# Patient Record
Sex: Female | Born: 1985 | Hispanic: Yes | Marital: Single | Smoking: Never smoker
Health system: Southern US, Community
[De-identification: ages and names within clinical notes are randomized; demographics above are authoritative.]

## PROBLEM LIST (undated history)

## (undated) DIAGNOSIS — Z789 Other specified health status: Secondary | ICD-10-CM

## (undated) HISTORY — PX: CYST REMOVAL TRUNK: SHX6283

---
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
## 2015-12-25 NOTE — L&D Delivery Note (Signed)
30 y.o. G4P1020 at 419w3d delivered a viable female infant in cephalic, OA position. No nuchal cord. Anterior shoulder delivered with ease. 60 sec delayed cord clamping. Cord clamped x2 and cut. Placenta delivered spontaneously intact, with 3VC. Fundus firm on exam with massage and pitocin. Good hemostasis noted.  Laceration: Right labial, clitoral extending to periurethral tear Suture: 3.0 Vicryl Good hemostasis noted. EBL: 250 cc  Mom and baby recovering in LDR.    Apgars:9,9 Weight:pending  Mom to postpartum.  Skin to skin, Couplet care.    Bethany MarchYashika Tsutomu Barfoot, MD PGY-1 12/13/2016, 2:30 PM

## 2015-12-25 NOTE — L&D Delivery Note (Signed)
30 y.o. G4P1020 at 4862w3d delivered a viable female infant in cephalic, OA position. No nuchal cord. Anterior shoulder delivered with ease. 60 sec delayed cord clamping. Cord clamped x2 and cut. Placenta delivered spontaneously intact, with 3VC. Fundus firm on exam with massage and pitocin. Good hemostasis noted.  Laceration: Right labial, clitoral extending to periurethral tear Suture: 3.0 Vicryl Good hemostasis noted. EBL: 250 cc  Mom and baby recovering in LDR.    Apgars:9,9 Weight:pending  Mom to postpartum.  Skin to skin, Couplet care.    Freddrick MarchYashika Amin, MD PGY-1 12/14/2016, 9:07 AM  I was present for the NSVD for this patient. I agree with the above documentation and findings. Luna KitchensKathryn Kooistra CNM

## 2016-09-10 ENCOUNTER — Other Ambulatory Visit (HOSPITAL_COMMUNITY): Payer: Self-pay | Admitting: Nurse Practitioner

## 2016-09-10 DIAGNOSIS — Z3689 Encounter for other specified antenatal screening: Secondary | ICD-10-CM

## 2016-09-10 DIAGNOSIS — Z3A25 25 weeks gestation of pregnancy: Secondary | ICD-10-CM

## 2016-09-10 LAB — OB RESULTS CONSOLE HIV ANTIBODY (ROUTINE TESTING): HIV: NONREACTIVE

## 2016-09-10 LAB — OB RESULTS CONSOLE HEPATITIS B SURFACE ANTIGEN: Hepatitis B Surface Ag: NEGATIVE

## 2016-09-10 LAB — OB RESULTS CONSOLE RPR: RPR: NONREACTIVE

## 2016-09-10 LAB — OB RESULTS CONSOLE HGB/HCT, BLOOD
HCT: 32 %
Hemoglobin: 11 g/dL

## 2016-09-10 LAB — OB RESULTS CONSOLE RUBELLA ANTIBODY, IGM: RUBELLA: IMMUNE

## 2016-09-10 LAB — OB RESULTS CONSOLE PLATELET COUNT: Platelets: 239 10*3/uL

## 2016-09-10 LAB — OB RESULTS CONSOLE VARICELLA ZOSTER ANTIBODY, IGG: VARICELLA IGG: IMMUNE

## 2016-09-10 LAB — OB RESULTS CONSOLE GC/CHLAMYDIA
CHLAMYDIA, DNA PROBE: NEGATIVE
Gonorrhea: NEGATIVE

## 2016-09-11 ENCOUNTER — Encounter (HOSPITAL_COMMUNITY): Payer: Self-pay | Admitting: Nurse Practitioner

## 2016-09-18 ENCOUNTER — Encounter (HOSPITAL_COMMUNITY): Payer: Self-pay | Admitting: *Deleted

## 2016-09-19 ENCOUNTER — Encounter (HOSPITAL_COMMUNITY): Payer: Self-pay

## 2016-09-19 ENCOUNTER — Ambulatory Visit (HOSPITAL_COMMUNITY)
Admission: RE | Admit: 2016-09-19 | Discharge: 2016-09-19 | Disposition: A | Discharge status: Home / Self Care | Payer: Self-pay | Source: Ambulatory Visit | Attending: Nurse Practitioner | Admitting: Nurse Practitioner

## 2016-09-19 ENCOUNTER — Other Ambulatory Visit (HOSPITAL_COMMUNITY): Payer: Self-pay | Admitting: Nurse Practitioner

## 2016-09-19 DIAGNOSIS — O09292 Supervision of pregnancy with other poor reproductive or obstetric history, second trimester: Secondary | ICD-10-CM

## 2016-09-19 DIAGNOSIS — O352XX0 Maternal care for (suspected) hereditary disease in fetus, not applicable or unspecified: Secondary | ICD-10-CM | POA: Insufficient documentation

## 2016-09-19 DIAGNOSIS — Z3689 Encounter for other specified antenatal screening: Secondary | ICD-10-CM

## 2016-09-19 DIAGNOSIS — Z3A26 26 weeks gestation of pregnancy: Secondary | ICD-10-CM | POA: Insufficient documentation

## 2016-09-19 DIAGNOSIS — O283 Abnormal ultrasonic finding on antenatal screening of mother: Secondary | ICD-10-CM | POA: Insufficient documentation

## 2016-09-19 DIAGNOSIS — Z3A25 25 weeks gestation of pregnancy: Secondary | ICD-10-CM

## 2016-09-19 DIAGNOSIS — O09299 Supervision of pregnancy with other poor reproductive or obstetric history, unspecified trimester: Secondary | ICD-10-CM

## 2016-09-19 HISTORY — DX: Other specified health status: Z78.9

## 2016-09-20 ENCOUNTER — Other Ambulatory Visit (HOSPITAL_COMMUNITY): Payer: Self-pay | Admitting: *Deleted

## 2016-09-20 ENCOUNTER — Encounter (HOSPITAL_COMMUNITY): Payer: Self-pay

## 2016-09-20 DIAGNOSIS — O09299 Supervision of pregnancy with other poor reproductive or obstetric history, unspecified trimester: Secondary | ICD-10-CM | POA: Insufficient documentation

## 2016-09-20 DIAGNOSIS — IMO0002 Reserved for concepts with insufficient information to code with codable children: Secondary | ICD-10-CM

## 2016-09-20 NOTE — Progress Notes (Signed)
Genetic Counseling  High-Risk Gestation Note  Appointment Date:  09/19/2016 Referred By: Bethany Bloom, NP Date of Birth:  [birth year]   Pregnancy History: [id number] Estimated Date of Delivery: 12/24/16 Estimated Gestational Age: 58w3dAttending: MRenella Cunas MD  I met with Ms. Bethany Boundsfor genetic counseling because of two prior pregnancies with anomalies. [employer] provided interpretation for today's visit.   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, small lungs, and a bump on the back of the skull  Subsequent pregnancy (a female) noted at [redacted] weeks gestation ultrasound to "have the same thing as the other baby," and patient pursued TAB  Underlying cause not known by patient; [place]  Discussed that description is suggestive of underlying genetic condition, with possible autosomal recessive inheritance, or less likely a chromosome condition  Reviewed genes, chromosomes, and autosomal recessive inheritance  Recurrence risk for current pregnancy would be low in the case of autosomal recessive inheritance, given that this is a different father of the pregnancy and reported family history  Recurrence risk estimated would differ in the case of genetic condition with different inheritance pattern or chromosomal condition  Discussed options of screening / testing  Detailed ultrasound- performed today, pyelectasis present  [place] patient (records not available to confirm this report)  Expanded carrier screening for patient- declined  Follow-up ultrasound scheduled for 11/02/16 given presence of pyelectasis  Discussed general population carrier  screening options-declined  We began by reviewing the family history in detail. The patient reported three previous pregnancies with a previous partner. The current pregnancy has a different father from her previous pregnancies. Her first pregnancy resulted in first trimester miscarriage. Her second pregnancy, a female, was born with multiple anomalies. He reportedly had polydactyly of all extremities, and a little ball/bump at the back of his skull that was described by the patient to not have anything inside of it. He also reportedly had very small lungs and lived 5 hours. [place] Her subsequent pregnancy was noted to be a female at the patient's [redacted] weeks gestation ultrasound and also visualized at that time to "show the same signs" as the patient's previous child. Given these findings, the patient [place] reported that genetic testing was not able to be performed for these pregnancies, and a specific diagnosis was not known by the patient. Medical records release was signed by the patient, and [place] been [place] yet been received.   We discussed that the reported family history is suggestive of an underlying genetic etiology. We reviewed genes, chromosomes, and different patterns of inheritance. We specifically spent time discussing that the reported family history could fit with an autosomal recessive condition. 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 However, we reviewed that a definitive etiology cannot be determined by the family history report. Meckel-Gruber syndrome (MKS) is characterized by occipital encephalocele, polycystic kidneys, hepatic developmental defects, and postaxial polydactyly. MKS has a very poor prognosis, with almost all individuals being stillborn or dying in the neonatal period. We  reviewed that MKS is an autosomal recessive condition with at least 6 causative  genes identified. The incidence ranges from 1 in 13,250 to 1 in 140,000. The incidence has been reported to be higher in the [place] population. We discussed that carrier screening could be performed for the patient. However, we reviewed the limitations of carrier screening given the number of potential genes causative for MKS and given that the underlying condition could be a different condition from MKS.  We reviewed autosomal recessive inheritance where a pregnancy is at a 25% chance to inherit the condition when both parents are carriers. In the case of an autosomal recessive condition, Bethany Brewer would be an obligate carrier. Given that the father of the current pregnancy has no known family history of a similar condition to the previous pregnancies and no known consanguinity to the patient, he would have the general population chance to be a carrier, which varies by condition. We discussed that carrier testing may be available for relatives, if a causative familial mutation has first been identified. It is not typically informative to first start genetic testing for relatives in the absence of an identified familial mutation given that testing may not be informative. We discussed the option of expanded carrier screening panel for the patient. We reviewed that this panel includes a wide range of autosomal recessive and X-linked recessive conditions, including one gene causative for MKS. We reviewed that benefits and limitations of carrier screening, with limitations including that the causative gene may not be included on the particular panel and that the detection rates are not well defined for some of the conditions. We reviewed that in the case of a different underlying pattern of inheritance or in the case of a chromosome condition, recurrence risk estimate may change.   In the absence of a known etiology, we  discussed the option of targeted ultrasound to assess for the described features that were present in the previous pregnancies. The patient stated that she feels comfortable with ultrasound screening in pregnancy at this time. She declined expanded carrier screening.  However, the patient understands that ultrasound cannot rule out or detect all birth defects or genetic conditions.   The family histories were otherwise found to be noncontributory for birth defects, intellectual disability, and known genetic conditions. Without further information regarding the provided family history, an accurate genetic risk cannot be calculated. Further genetic counseling is warranted if more information is obtained.  Detailed ultrasound was performed today. Unilateral right urinary tract dilation (5.2 mm) was visualized. Remaining visualized fetal anatomy within normal range. Complete ultrasound results reported separately. This was reportedly previously visualized during the patient's evaluation in [place].   We discussed that fetal pyelectasis is defined as the dilatation of the fetal renal pelvis/pelvises due to excess urine. This finding is estimated to occur in 2-3% of fetuses.  The female to female ratio is 2:1.  Typically, babies with mild pyelectasis are born normal and healthy and we are usually unable to determine why this extra fluid is present.  This urine accumulation may regress, stay the same or continue to accumulate.  The more fluid that accumulates, the more likely this fluid could be the result of a compromise in kidney function, an obstruction, or narrowing of the ureters which transport urine out of the body, thus causing backflow of fluid into the kidneys.  Therefore, it is important to follow pyelectasis to make sure it does not become more concerning.  Also, in some cases postnatal evaluation of baby's kidneys may be warranted.  We discussed that the finding of pyelectasis is  associated with an  increased risk for fetal aneuploidy.  This risk is highest when other anomalies or fetal differences are visualized.  Bethany Brewer reported that she previously had blood work for chromosome conditions, which was within normal range. The medical documentation received from [place] indicates that the patient has NIPT, but the lab result was not available to [place]. We reviewed the methodology of noninvasive prenatal screening (NIPS)/prenatal cell free DNA, the conditions for which it screens and the detection rates of each. We discussed the option of amniocentesis for chromosome analysis, including the risks, benefits, and limitations. The patient declined amniocentesis.   Bethany Brewer denied exposure to environmental toxins or chemical agents. She denied the use of alcohol, tobacco or street drugs. She denied significant viral illnesses during the course of her pregnancy. Her medical and surgical histories were otherwise noncontributory.   I counseled Bethany Brewer regarding the above risks and available options.  The approximate face-to-face time with the genetic counselor was 45 minutes.  Chipper Oman, MS Certified Genetic Counselor 09/20/2016

## 2016-09-24 ENCOUNTER — Encounter (HOSPITAL_COMMUNITY): Payer: Self-pay

## 2016-09-24 DIAGNOSIS — Z3A26 26 weeks gestation of pregnancy: Secondary | ICD-10-CM | POA: Insufficient documentation

## 2016-10-02 ENCOUNTER — Encounter: Payer: Self-pay | Admitting: Family

## 2016-10-05 ENCOUNTER — Other Ambulatory Visit (HOSPITAL_COMMUNITY): Payer: Self-pay

## 2016-10-05 LAB — OB RESULTS CONSOLE HGB/HCT, BLOOD
HCT: 36 %
Hemoglobin: 11.8 g/dL

## 2016-10-10 ENCOUNTER — Encounter (HOSPITAL_COMMUNITY): Payer: Self-pay

## 2016-10-10 ENCOUNTER — Other Ambulatory Visit (HOSPITAL_COMMUNITY): Payer: Self-pay

## 2016-11-02 ENCOUNTER — Encounter (HOSPITAL_COMMUNITY): Payer: Self-pay

## 2016-11-02 ENCOUNTER — Ambulatory Visit (HOSPITAL_COMMUNITY)
Admission: RE | Admit: 2016-11-02 | Discharge: 2016-11-02 | Disposition: A | Discharge status: Home / Self Care | Payer: Self-pay | Source: Ambulatory Visit | Attending: Nurse Practitioner | Admitting: Nurse Practitioner

## 2016-11-02 ENCOUNTER — Other Ambulatory Visit (HOSPITAL_COMMUNITY): Payer: Self-pay | Admitting: Maternal and Fetal Medicine

## 2016-11-02 DIAGNOSIS — Z3A32 32 weeks gestation of pregnancy: Secondary | ICD-10-CM | POA: Insufficient documentation

## 2016-11-02 DIAGNOSIS — O9989 Other specified diseases and conditions complicating pregnancy, childbirth and the puerperium: Secondary | ICD-10-CM | POA: Insufficient documentation

## 2016-11-02 DIAGNOSIS — IMO0002 Reserved for concepts with insufficient information to code with codable children: Secondary | ICD-10-CM

## 2016-11-02 DIAGNOSIS — N1330 Unspecified hydronephrosis: Secondary | ICD-10-CM | POA: Insufficient documentation

## 2016-11-05 ENCOUNTER — Other Ambulatory Visit (HOSPITAL_COMMUNITY): Payer: Self-pay | Admitting: Nurse Practitioner

## 2016-11-05 DIAGNOSIS — Z3689 Encounter for other specified antenatal screening: Secondary | ICD-10-CM

## 2016-11-05 DIAGNOSIS — Z3A33 33 weeks gestation of pregnancy: Secondary | ICD-10-CM

## 2016-11-30 ENCOUNTER — Ambulatory Visit (HOSPITAL_COMMUNITY)
Admission: RE | Admit: 2016-11-30 | Discharge: 2016-11-30 | Disposition: A | Discharge status: Home / Self Care | Payer: Self-pay | Source: Ambulatory Visit | Attending: Nurse Practitioner | Admitting: Nurse Practitioner

## 2016-11-30 ENCOUNTER — Other Ambulatory Visit (HOSPITAL_COMMUNITY): Payer: Self-pay | Admitting: Nurse Practitioner

## 2016-11-30 ENCOUNTER — Encounter (HOSPITAL_COMMUNITY): Payer: Self-pay

## 2016-11-30 DIAGNOSIS — O09299 Supervision of pregnancy with other poor reproductive or obstetric history, unspecified trimester: Secondary | ICD-10-CM

## 2016-11-30 DIAGNOSIS — O35EXX0 Maternal care for other (suspected) fetal abnormality and damage, fetal genitourinary anomalies, not applicable or unspecified: Secondary | ICD-10-CM

## 2016-11-30 DIAGNOSIS — O358XX0 Maternal care for other (suspected) fetal abnormality and damage, not applicable or unspecified: Secondary | ICD-10-CM

## 2016-11-30 DIAGNOSIS — Z3A36 36 weeks gestation of pregnancy: Secondary | ICD-10-CM

## 2016-11-30 DIAGNOSIS — Z3689 Encounter for other specified antenatal screening: Secondary | ICD-10-CM

## 2016-11-30 DIAGNOSIS — Z3A33 33 weeks gestation of pregnancy: Secondary | ICD-10-CM

## 2016-11-30 NOTE — Addendum Note (Signed)
Encounter addended by: Jason FilaMesha T Dishon Kehoe on: 11/30/2016  4:13 PM<BR>    Actions taken: Imaging Exam ended

## 2016-12-03 LAB — OB RESULTS CONSOLE GBS: GBS: NEGATIVE

## 2016-12-13 ENCOUNTER — Inpatient Hospital Stay (HOSPITAL_COMMUNITY)
Admission: AD | Admit: 2016-12-13 | Discharge: 2016-12-15 | DRG: 775 | Disposition: A | Discharge status: Home / Self Care | Payer: Medicaid Other | Source: Ambulatory Visit | Attending: Obstetrics & Gynecology | Admitting: Obstetrics & Gynecology

## 2016-12-13 ENCOUNTER — Encounter (HOSPITAL_COMMUNITY): Payer: Self-pay

## 2016-12-13 DIAGNOSIS — Z3493 Encounter for supervision of normal pregnancy, unspecified, third trimester: Secondary | ICD-10-CM | POA: Diagnosis present

## 2016-12-13 DIAGNOSIS — Z3A38 38 weeks gestation of pregnancy: Secondary | ICD-10-CM | POA: Diagnosis not present

## 2016-12-13 DIAGNOSIS — O09299 Supervision of pregnancy with other poor reproductive or obstetric history, unspecified trimester: Secondary | ICD-10-CM

## 2016-12-13 LAB — CBC
HEMATOCRIT: 35.1 % — AB (ref 36.0–46.0)
Hemoglobin: 12.2 g/dL (ref 12.0–15.0)
MCH: 30.7 pg (ref 26.0–34.0)
MCHC: 34.8 g/dL (ref 30.0–36.0)
MCV: 88.4 fL (ref 78.0–100.0)
Platelets: 102 10*3/uL — ABNORMAL LOW (ref 150–400)
RBC: 3.97 MIL/uL (ref 3.87–5.11)
RDW: 13.7 % (ref 11.5–15.5)
WBC: 11.5 10*3/uL — ABNORMAL HIGH (ref 4.0–10.5)

## 2016-12-13 LAB — TYPE AND SCREEN
ABO/RH(D): O POS
Antibody Screen: NEGATIVE

## 2016-12-13 LAB — ABO/RH: ABO/RH(D): O POS

## 2016-12-13 MED ORDER — BENZOCAINE-MENTHOL 20-0.5 % EX AERO
1.0000 "application " | INHALATION_SPRAY | CUTANEOUS | Status: DC | PRN
  Administered 2016-12-13: 1 via TOPICAL
  Filled 2016-12-13: qty 56

## 2016-12-13 MED ORDER — ZOLPIDEM TARTRATE 5 MG PO TABS: 5.0000 mg | ORAL_TABLET | Freq: Every evening | ORAL | Status: DC | PRN

## 2016-12-13 MED ORDER — TETANUS-DIPHTH-ACELL PERTUSSIS 5-2.5-18.5 LF-MCG/0.5 IM SUSP: 0.5000 mL | Freq: Once | INTRAMUSCULAR | Status: DC

## 2016-12-13 MED ORDER — OXYTOCIN 40 UNITS IN LACTATED RINGERS INFUSION - SIMPLE MED
2.5000 [IU]/h | INTRAVENOUS | Status: DC
  Administered 2016-12-13: 2.5 [IU]/h via INTRAVENOUS
  Filled 2016-12-13: qty 1000

## 2016-12-13 MED ORDER — DIBUCAINE 1 % RE OINT: 1.0000 "application " | TOPICAL_OINTMENT | RECTAL | Status: DC | PRN

## 2016-12-13 MED ORDER — FENTANYL CITRATE (PF) 100 MCG/2ML IJ SOLN: 100.0000 ug | INTRAMUSCULAR | Status: DC | PRN

## 2016-12-13 MED ORDER — ACETAMINOPHEN 325 MG PO TABS
650.0000 mg | ORAL_TABLET | ORAL | Status: DC | PRN
  Administered 2016-12-13: 650 mg via ORAL
  Filled 2016-12-13: qty 2

## 2016-12-13 MED ORDER — ONDANSETRON HCL 4 MG/2ML IJ SOLN: 4.0000 mg | Freq: Four times a day (QID) | INTRAMUSCULAR | Status: DC | PRN

## 2016-12-13 MED ORDER — WITCH HAZEL-GLYCERIN EX PADS: 1.0000 "application " | MEDICATED_PAD | CUTANEOUS | Status: DC | PRN

## 2016-12-13 MED ORDER — ACETAMINOPHEN 325 MG PO TABS: 650.0000 mg | ORAL_TABLET | ORAL | Status: DC | PRN

## 2016-12-13 MED ORDER — ONDANSETRON HCL 4 MG/2ML IJ SOLN: 4.0000 mg | INTRAMUSCULAR | Status: DC | PRN

## 2016-12-13 MED ORDER — SIMETHICONE 80 MG PO CHEW
80.0000 mg | CHEWABLE_TABLET | ORAL | Status: DC | PRN
Start: 2016-12-13 — End: 2016-12-15

## 2016-12-13 MED ORDER — OXYCODONE-ACETAMINOPHEN 5-325 MG PO TABS: 1.0000 | ORAL_TABLET | ORAL | Status: DC | PRN

## 2016-12-13 MED ORDER — LACTATED RINGERS IV SOLN
INTRAVENOUS | Status: DC
  Administered 2016-12-13: 11:00:00 via INTRAVENOUS

## 2016-12-13 MED ORDER — LACTATED RINGERS IV SOLN: 500.0000 mL | INTRAVENOUS | Status: DC | PRN

## 2016-12-13 MED ORDER — PRENATAL MULTIVITAMIN CH
1.0000 | ORAL_TABLET | Freq: Every day | ORAL | Status: DC
  Administered 2016-12-14 – 2016-12-15 (×2): 1 via ORAL
  Filled 2016-12-13 (×2): qty 1

## 2016-12-13 MED ORDER — ONDANSETRON HCL 4 MG PO TABS: 4.0000 mg | ORAL_TABLET | ORAL | Status: DC | PRN

## 2016-12-13 MED ORDER — IBUPROFEN 600 MG PO TABS
600.0000 mg | ORAL_TABLET | Freq: Four times a day (QID) | ORAL | Status: DC
  Administered 2016-12-13 – 2016-12-15 (×8): 600 mg via ORAL
  Filled 2016-12-13 (×8): qty 1

## 2016-12-13 MED ORDER — FLEET ENEMA 7-19 GM/118ML RE ENEM: 1.0000 | ENEMA | RECTAL | Status: DC | PRN

## 2016-12-13 MED ORDER — LIDOCAINE HCL (PF) 1 % IJ SOLN
30.0000 mL | INTRAMUSCULAR | Status: AC | PRN
  Administered 2016-12-13: 30 mL via SUBCUTANEOUS
  Filled 2016-12-13: qty 30

## 2016-12-13 MED ORDER — DIPHENHYDRAMINE HCL 25 MG PO CAPS: 25.0000 mg | ORAL_CAPSULE | Freq: Four times a day (QID) | ORAL | Status: DC | PRN

## 2016-12-13 MED ORDER — SOD CITRATE-CITRIC ACID 500-334 MG/5ML PO SOLN: 30.0000 mL | ORAL | Status: DC | PRN

## 2016-12-13 MED ORDER — OXYTOCIN BOLUS FROM INFUSION
500.0000 mL | Freq: Once | INTRAVENOUS | Status: AC
  Administered 2016-12-13: 500 mL via INTRAVENOUS

## 2016-12-13 MED ORDER — OXYCODONE-ACETAMINOPHEN 5-325 MG PO TABS: 2.0000 | ORAL_TABLET | ORAL | Status: DC | PRN

## 2016-12-13 MED ORDER — COCONUT OIL OIL
1.0000 | TOPICAL_OIL | Status: DC | PRN
Start: 2016-12-13 — End: 2016-12-15
  Administered 2016-12-15: 1 via TOPICAL
  Filled 2016-12-13: qty 120

## 2016-12-13 MED ORDER — SENNOSIDES-DOCUSATE SODIUM 8.6-50 MG PO TABS
2.0000 | ORAL_TABLET | ORAL | Status: DC
  Administered 2016-12-13 – 2016-12-15 (×2): 2 via ORAL
  Filled 2016-12-13 (×2): qty 2

## 2016-12-13 NOTE — Anesthesia Pain Management Evaluation Note (Signed)
  CRNA Pain Management Visit Note  Patient: Bethany Brewer, 30 y.o., female  "Hello I am a [facility] of the [facility]. We have an anesthesia team available at all times to provide care throughout the hospital, including epidural management and anesthesia for C-section. I don't know your plan for the delivery whether it a natural birth, water birth, IV sedation, nitrous supplementation, doula or epidural, but we want to meet your pain goals."   1.Was your pain managed to your expectations on prior hospitalizations?   Yes   2.What is your expectation for pain management during this hospitalization?     Labor support without medications  3.How can we help you reach that goal? Labor support  Do not want epidural.  Record the patient's initial score and the patient's pain goal.   Pain: 6  Pain Goal: 10 [facility] wants you to be able to say your pain was always managed very well.  Quentina Fronek 12/13/2016

## 2016-12-13 NOTE — MAU Note (Signed)
Pt presents complaining of contractions every 7 minutes that started at 2am. Denies leaking of fluid or bleeding. Reports good fetal movement.

## 2016-12-13 NOTE — Progress Notes (Signed)
Patient ID: Fara ChuteYuri Zavala-Saravia, female   DOB: 19-Dec-1986, 30 y.o.   MRN: [id number]  OB Interim Progress Note  S: Patient starting to feel discomfort with contractions however declines pain medications at this time.  Agreeable to AROM.  CTX about 4-5 min apart.   O: BP 128/71   Pulse 96   Temp 98.2 F (36.8 C) (Oral)   Resp 20   Ht 4\' 11"  (1.499 m)   Wt 174 lb (78.9 kg)   LMP 03/19/2016   BMI 35.14 kg/m    FHR: baseline 130s, moderate variability, +accels, no decels  Dilation: 7 Effacement (%): 100 Station: -2, -1 Presentation: Vertex Exam by:: Kenai Fluegel, MD  A/P: AROM at 12:15.  Cervix 7cm.  Continue expectant management Anticipate SVD  Freddrick MarchYashika Aleksander Edmiston, MD 12/13/2016, 12:19 PM PGY-1

## 2016-12-13 NOTE — Lactation Note (Signed)
This note was copied from a baby's chart. Lactation Consultation Note  Patient Name: Bethany Brewer NWGNF'AToday's Date: 12/13/2016 Reason for consult: Initial assessment   Initial consult with first time mom of 2 hour old infant. Spoke with mother with assistance of [address], [place]. This is mom's first child, she is raising a nephew.   Mom reports she feels infant is BF well. Infant currently asleep. Enc mom to feed infant 8-12 x in 24 hours at first feeding cues. Enc mom to feed infant STS. Showed mom how to hand express and colostrum noted from left breast. Enc mom to hand express prior to latch to get milk flowing and to apply EBM to nipples post BF.   BF Resources Handout and LC Brochure given, mom informed of IP/OP Services, BF Support Groups and LC phone #. Mom without questions/concerns at this time. Enc mom to call out to desk for feeding assistance as needed.  [facility] and is aware to call to make appt after d/c. Dr. Ronalee RedHartsell was in the room and asked mom to make follow up Ped appt on Monday.   Maternal Data Formula Feeding for Exclusion: No Has patient been taught Hand Expression?: Yes Does the patient have breastfeeding experience prior to this delivery?: No  Feeding Feeding Type: Breast Fed Length of feed: 15 min  LATCH Score/Interventions Latch: Repeated attempts needed to sustain latch, nipple held in mouth throughout feeding, stimulation needed to elicit sucking reflex. Intervention(s): Adjust position;Assist with latch;Breast massage;Breast compression  Audible Swallowing: None Intervention(s): Skin to skin;Hand expression  Type of Nipple: Everted at rest and after stimulation  Comfort (Breast/Nipple): Soft / non-tender     Hold (Positioning): Assistance needed to correctly position infant at breast and maintain latch. Intervention(s): Breastfeeding basics reviewed;Position options;Skin to skin  LATCH Score: 6  Lactation  Tools Discussed/Used WIC Program: Yes   Consult Status Consult Status: Follow-up Date: 12/14/16 Follow-up type: In-patient    Silas FloodSharon S Rozell Kettlewell 12/13/2016, 4:29 PM

## 2016-12-13 NOTE — H&P (Signed)
LABOR AND DELIVERY ADMISSION HISTORY AND PHYSICAL NOTE  Bethany Brewer is a 30 y.o. female 74P1020 with IUP at 2431w3d by LMP presenting for SOL.  Began feeling contractions around 11AM.    She reports positive fetal movement. She denies leakage of fluid.  Has had slight vaginal bleeding.    Prenatal History/Complications: None significant . First pregnancy neonatal demise several hours after birth.  Second IUFD at 6 months.   This pregnancy with bilateral pyelectasis.   Past Medical History: Past Medical History:  Diagnosis Date  . Medical history non-contributory    Past Surgical History: Past Surgical History:  Procedure Laterality Date  . CYST REMOVAL TRUNK     Obstetrical History: OB History    Gravida Para Term Preterm AB Living   4 1 1  0 2 0   SAB TAB Ectopic Multiple Live Births   1 1     1      Social History: Social History   Social History  . Marital status: Unknown    Spouse name: N/A  . Number of children: N/A  . Years of education: N/A   Social History Main Topics  . Smoking status: Never Smoker  . Smokeless tobacco: Never Used  . Alcohol use No  . Drug use: No  . Sexual activity: Not Asked   Other Topics Concern  . None   Social History Narrative  . None   Family History: Family History  Problem Relation Age of Onset  . Birth defects Son     polydactyly, bump on skull, small lungs   Allergies: No Known Allergies  Prescriptions Prior to Admission  Medication Sig Dispense Refill Last Dose  . Prenatal Vit-Fe Fumarate-FA (PRENATAL VITAMIN PO) Take 1 tablet by mouth at bedtime.    12/12/2016 at Unknown time   Review of Systems   All systems reviewed and negative except as stated in HPI  Blood pressure 109/87, pulse 100, temperature 98.2 F (36.8 C), temperature source Oral, resp. rate 19, height 4\' 11"  (1.499 m), weight 174 lb (78.9 kg), last menstrual period 03/19/2016. General appearance: alert, cooperative, appears stated age and no  distress Lungs: clear to auscultation bilaterally Heart: regular rate and rhythm Abdomen: soft, non-tender; bowel sounds normal Extremities: No calf swelling or tenderness Presentation: cephalic Fetal monitoring: Baseline 140s, moderate variability, +accels, no decels Uterine activity: Q2-4 min Dilation: 4 Effacement (%): 100 Station: -2 Exam by:: Morrison Oldee Carter RN  Prenatal labs: ABO, Rh:  O positive Antibody:  Negative Rubella: Immune  RPR:   Neg HBsAg:   NEG HIV:   Nonreactive  GBS:   Negative  1 hr Glucola: wnl Genetic screening:  Too late Anatomy US: fetal pyelectasis  Prenatal Transfer Tool  Maternal Diabetes: No Genetic Screening: too late Maternal Ultrasounds/Referrals: Normal Fetal Ultrasounds or other Referrals:  Referred to Materal Fetal Medicine  Maternal Substance Abuse:  No Significant Maternal Medications:  None Significant Maternal Lab Results: None  No results found for this or any previous visit (from the past 24 hour(s)).  Patient Active Problem List   Diagnosis Date Noted  . Normal labor 12/13/2016  . [redacted] weeks gestation of pregnancy   . 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 G4P1020 at 6431w3d here for SOL.    #Labor:Anticipate NVSD  #Pain: Does not desire IV pain meds or epidural #FWB: Cat I #ID:  GBS Negative #MOF: Breast #MOC: OCPs  #Circ:  Not desired.  Freddrick MarchYashika Amin, MD PGY-1 12/13/2016, 11:24 AM   OB FELLOW HISTORY AND PHYSICAL ATTESTATION  I have seen and examined this patient; I agree with above documentation in the resident's note.    Jen MowElizabeth Hildreth Robart, DO OB Fellow

## 2016-12-14 LAB — RPR: RPR: NONREACTIVE

## 2016-12-14 NOTE — Lactation Note (Signed)
This note was copied from a baby's chart. Lactation Consultation Note  Patient Name: Boy Fara ChuteYuri Zavala-Saravia AVWUJ'WToday's Date: 12/14/2016 Reason for consult: Follow-up assessment    With this mom and term baby, now 420 hours old. RN Gaylyn RongKris had already helped mom with latching baby in football hold. The baby was latched deeply, with strong, rhythmic suckles, visibly pulling breast into his mouth, great breast movement, visible swallows. Mom encouraged to hold her breast during the feeding, to not make a "breathing space" for baby, to hold baby firmly behind his neck, which allows for baby to move his head to take a deep breath. Mom knows to call for questions/conerns.  Maternal Data    Feeding Feeding Type: Breast Fed Length of feed: 50 min  LATCH Score/Interventions Latch: Grasps breast easily, tongue down, lips flanged, rhythmical sucking. Intervention(s): Adjust position;Assist with latch  Audible Swallowing: A few with stimulation  Type of Nipple: Everted at rest and after stimulation  Comfort (Breast/Nipple): Soft / non-tender     Hold (Positioning): Assistance needed to correctly position infant at breast and maintain latch.  LATCH Score: 8  Lactation Tools Discussed/Used     Consult Status Consult Status: Follow-up Follow-up type: Call as needed    Alfred LevinsLee, Clarissia Mckeen Anne 12/14/2016, 10:20 AM

## 2016-12-15 MED ORDER — IBUPROFEN 600 MG PO TABS: 600.0000 mg | ORAL_TABLET | Freq: Four times a day (QID) | ORAL | 2 refills | Status: DC

## 2016-12-15 NOTE — Lactation Note (Signed)
This note was copied from a baby's chart. Lactation Consultation Note  Patient Name: Bethany Fara ChuteYuri Brewer WUJWJ'XToday's Date: 12/15/2016 Reason for consult: Follow-up assessment   With this mom and baby, now 244 hours old. Mom was able to apply nipple shield independently, and had baby latched deeply, with strong suckles and swallows. I will follow up with Bethany HuhMarta, Spanish interpreter, to go over o/p information, prior to discharge.    Maternal Data    Feeding Feeding Type: Breast Fed  LATCH Score/Interventions Latch: Grasps breast easily, tongue down, lips flanged, rhythmical sucking. Intervention(s): Adjust position;Assist with latch  Audible Swallowing: Spontaneous and intermittent  Type of Nipple: Everted at rest and after stimulation Intervention(s): Hand pump (semi flat today, some edema, sore nipples, better with 24 nipple shiels)  Comfort (Breast/Nipple): Soft / non-tender  Problem noted: Severe discomfort (tender nipples, slightly cracked tips of nipples)  Hold (Positioning): No assistance needed to correctly position infant at breast. Intervention(s): Breastfeeding basics reviewed;Support Pillows;Position options  LATCH Score: 10  Lactation Tools Discussed/Used Tools: Nipple Shields Nipple shield size: 24   Consult Status Consult Status: Follow-up Date: 12/16/16 Follow-up type: In-patient    Bethany LevinsLee, [name] Anne 12/15/2016, 10:14 AM

## 2016-12-15 NOTE — Lactation Note (Signed)
This note was copied from a baby's chart. Lactation Consultation Note  Patient Name: Bethany Brewer ZOXWR'UToday's Date: 12/15/2016 Reason for consult: Follow-up assessment   With this mom and term baby. Mom's nipple/areolas soft but puffy today. Mom having trouble latching baby without severe nipple discomfort. I fitted mom with a 24 nipple shield, and then assisted with latching baby, in football hold. He latched deeply, strong suckles and swallows, mom's breast full but soft, mom denies pain. Wallene HuhMarta, spanish interpreter, to come back to continue teaching on application of shield, and o/p lactation appointment., and use of manual hand pump, and supplement with EBm.   Maternal Data    Feeding Feeding Type: Breast Fed Length of feed: 40 min  LATCH Score/Interventions Latch: Repeated attempts needed to sustain latch, nipple held in mouth throughout feeding, stimulation needed to elicit sucking reflex. Intervention(s): Adjust position;Assist with latch  Audible Swallowing: Spontaneous and intermittent  Type of Nipple: Flat Intervention(s): Hand pump (semi flat today, some edema, sore nipples, better with 24 nipple shiels)  Comfort (Breast/Nipple): Filling, red/small blisters or bruises, mild/mod discomfort  Problem noted: Severe discomfort (tender nipples, slightly cracked tips of nipples)  Hold (Positioning): Assistance needed to correctly position infant at breast and maintain latch. Intervention(s): Breastfeeding basics reviewed;Support Pillows;Position options  LATCH Score: 6  Lactation Tools Discussed/Used     Consult Status Consult Status: Follow-up Date: 12/20/16 Follow-up type: Out-patient    Alfred LevinsLee, [name] 12/15/2016, 8:36 AM

## 2016-12-15 NOTE — Discharge Instructions (Signed)
Parto vaginal, cuidados posteriores  (Vaginal Delivery, Care After)  Siga estas instrucciones durante las próximas semanas. Estas indicaciones le proporcionan información acerca de cómo deberá cuidarse después del parto. El médico también podrá darle instrucciones más específicas. El tratamiento ha sido planificado según las prácticas médicas actuales, pero en algunos casos pueden ocurrir problemas. Llame al médico si tiene problemas o preguntas.  QUÉ ESPERAR DESPUÉS DEL PARTO  Después de un parto vaginal, es frecuente tener lo siguiente:  · Hemorragia leve de la vagina.  · Dolor en el abdomen, la vagina y la zona de la piel entre la abertura vaginal y el ano (perineo).  · Calambres pélvicos.  · Fatiga.  INSTRUCCIONES PARA EL CUIDADO EN EL HOGAR  Medicamentos  · Tome los medicamentos de venta libre y los recetados solamente como se lo haya indicado el médico.  · Si le recetaron un antibiótico, tómelo como se lo haya indicado el médico. No interrumpa la administración del antibiótico hasta que lo haya terminado.  Conducir  · No conduzca ni opere maquinaria pesada mientras toma analgésicos recetados.  · No conduzca durante 24 horas si le administraron un sedante.  Estilo de vida  · No beba alcohol. Esto es de suma importancia si está amamantando o toma analgésicos.  · No consuma productos que contengan tabaco, incluidos cigarrillos, tabaco de mascar o cigarrillos electrónicos. Si necesita ayuda para dejar de fumar, consulte al médico.  Comida y bebida  · Beba al menos 8 vasos de 8 onzas (240 cc) de agua todos los días a menos que el médico le indique lo contrario. Si elige amamantar al bebé, quizá deba beber aún más cantidad de agua.  · Ingiera alimentos ricos en fibras todos los días. Estos alimentos pueden ayudarla a prevenir o aliviar el estreñimiento. Los alimentos ricos en fibras incluyen, entre otros:  ? Panes y cereales integrales.  ? Arroz integral.  ? Frijoles.  ? Frutas y verduras  frescas.  Actividad  · Reanude sus actividades normales como se lo haya indicado el médico. Pregúntele al médico qué actividades son seguras para usted.  · Descanse todo lo que pueda. Trate de descansar o tomar una siesta mientras el bebé está durmiendo.  · No levante objetos que pesen más de 10 libras (4,5 kg) hasta que el médico le diga que es seguro hacerlo.  · Hable con el médico sobre cuándo puede volver a tener relaciones sexuales. Esto puede depender de lo siguiente:  ? Riesgo de sufrir infecciones.  ? Velocidad de cicatrización.  ? Comodidad y deseo de tener relaciones sexuales.  Cuidados vaginales  · Si le realizaron una episiotomía o tuvo un desgarro vaginal, contrólese la zona todos los días para detectar signos de infección. Esté atenta a los siguientes signos:  ? Aumento del enrojecimiento, la hinchazón o el dolor.  ? Más líquido o sangre.  ? Calor.  ? Pus o mal olor.  · No use tampones ni se haga duchas vaginales hasta que el médico la autorice.  · Controle la sangre que elimina por la vagina para detectar coágulos. Pueden tener el aspecto de grumos de color rojo oscuro, marrón o negro.  Instrucciones generales  · Mantenga el perineo limpio y seco, como se lo haya indicado el médico.  · Use ropa cómoda y suelta.  · Cuando vaya al baño, siempre higienícese de adelante hacia atrás.  · Pregúntele al médico si puede ducharse o tomar baños de inmersión. Si se le realizó una episiotomía o tuvo un desgarro perineal durante el trabajo del parto o   el parto, es posible que el médico le indique que no tome baños de inmersión durante un determinado tiempo.  · Use un sostén que sujete y ajuste bien sus pechos.  · Si es posible, pídale a alguien que la ayude con las tareas del hogar y a cuidar del bebé durante al menos algunos días después de salir del hospital.  · Concurra a todas las visitas de seguimiento para usted y el bebé, como se lo haya indicado el médico. Esto es importante.  SOLICITE ATENCIÓN MÉDICA  SI:  · Tiene los siguientes síntomas:  ? Secreción vaginal que tiene mal olor.  ? Dificultad para orinar.  ? Dolor al orinar.  ? Aumento o disminución repentinos de la frecuencia con que defeca.  ? Más enrojecimiento, hinchazón o dolor alrededor de la episiotomía o del desgarro vaginal.  ? Más secreción de líquido o sangre de la episiotomía o desgarro vaginal.  ? Pus o mal olor proveniente de la episiotomía o el desgarro vaginal.  ? Fiebre.  ? Erupción cutánea.  ? Poco interés o falta de interés en actividades que solían gustarle.  ? Dudas sobre su cuidado y el del bebé.  · Siente la episiotomía o el desgarro vaginal caliente al tacto.  · La episiotomía o el desgarro vaginal se está abriendo o no parece cicatrizar.  · Siente dolor en las mamas, o están duras o enrojecidas.  · Siente tristeza o preocupación de forma inusual.  · Siente náuseas o vomita.  · Elimina coágulos grandes por la vagina. Si expulsa un coágulo sanguíneo por la vagina, guárdelo para mostrárselo a su médico. No tire la cadena sin que el médico examine el coágulo antes.  · Orina más de lo habitual.  · Se siente mareada o se desmaya.  · No ha amamantado para nada y no ha tenido un período menstrual durante 12 semanas después del parto.  · Dejó de amamantar al bebé y no ha tenido su período menstrual durante 12 semanas después de dejar de amamantar.    SOLICITE ATENCIÓN MÉDICA DE INMEDIATO SI:  · Tiene los siguientes síntomas:  ? Dolor que no desaparece o no mejora con el medicamento.  ? Dolor en el pecho.  ? Dificultad para respirar.  ? Visión borrosa o manchas en la vista.  ? Pensamientos de autolesionarse o lesionar al bebé.  · Comienza a sentir dolor en el abdomen o en una de las piernas.  · Dolor de cabeza intenso.  · Se desmaya.  · Tiene una hemorragia tan intensa de la vagina que empapa dos toallitas sanitarias en una hora.    Esta información no tiene como fin reemplazar el consejo del médico. Asegúrese de hacerle al médico cualquier  pregunta que tenga.  Document Released: 12/10/2005 Document Revised: 04/02/2016 Document Reviewed: 12/25/2015  Elsevier Interactive Patient Education © 2017 [employer]

## 2016-12-15 NOTE — Lactation Note (Signed)
This note was copied from a baby's chart. Lactation Consultation Note  Patient Name: Bethany Brewer HYQMV'HToday's Date: 12/15/2016 Reason for consult: Follow-up assessment   With this mom and baby, now 7945 hours old. Breast care/engorgement reviewed with mom. Wallene HuhMarta, Spanish interpreter there . I reviewed o/p lactation information and use of manual hand pump with mom. Mom encouraged to pump if uncomfortable full, and baby not able to soften breasts, and feed EBm back to baby with bottle.    Maternal Data    Feeding Feeding Type: Breast Fed  LATCH Score/Interventions Latch: Grasps breast easily, tongue down, lips flanged, rhythmical sucking. Intervention(s): Adjust position;Assist with latch  Audible Swallowing: Spontaneous and intermittent  Type of Nipple: Everted at rest and after stimulation Intervention(s): Hand pump (semi flat today, some edema, sore nipples, better with 24 nipple shiels)  Comfort (Breast/Nipple): Soft / non-tender  Problem noted: Severe discomfort (tender nipples, slightly cracked tips of nipples)  Hold (Positioning): No assistance needed to correctly position infant at breast. Intervention(s): Breastfeeding basics reviewed;Support Pillows;Position options  LATCH Score: 10  Lactation Tools Discussed/Used Tools: Nipple Shields Nipple shield size: 24   Consult Status Consult Status: Complete Date: 12/20/16 Follow-up type: Out-patient    Alfred LevinsLee, Genni Buske Anne 12/15/2016, 11:06 AM

## 2016-12-15 NOTE — Discharge Summary (Signed)
OB Discharge Summary     Patient Name: Bethany Brewer DOB: [birth year] MRN: [id number]  Date of admission: 12/13/2016 Delivering MD: Freddrick MarchAMIN, YASHIKA   Date of discharge: 12/15/2016  Admitting diagnosis: 39 weeks having contractions Intrauterine pregnancy: 7322w3d     Secondary diagnosis:  Active Problems:   Normal labor  Additional problems: None     Discharge diagnosis: Term Pregnancy Delivered                                                                                                Post partum procedures:none  Augmentation: AROM  Complications: None  Hospital course:  Onset of Labor With Vaginal Delivery     30 y.o. yo [id number] at 4622w3d was admitted in Active Labor on 12/13/2016. Patient had an uncomplicated labor course as follows:  Membrane Rupture Time/Date: 12:16 PM ,12/13/2016   Intrapartum Procedures: Episiotomy: None [1]                                         Lacerations:  Labial [10];Periurethral [8]  Patient had a delivery of a Viable infant. 12/13/2016  Information for the patient's newborn:  Allegra GranaZavala-Saravia, Boy Aleta [[id number]][[id number]]  Delivery Method: Vaginal, Spontaneous Delivery (Filed from Delivery Summary)    Pateint had an uncomplicated postpartum course.  She is ambulating, tolerating a regular diet, passing flatus, and urinating well. Patient is discharged home in stable condition on 12/15/16.    Physical exam Vitals:   12/13/16 1602 12/13/16 2015 12/14/16 0559 12/15/16 0558  BP: (!) 138/92 110/60 126/74 112/74  Pulse: 93 73 84 64  Resp: 18 18 18 18   Temp: 98 F (36.7 C) 98 F (36.7 C) 98.2 F (36.8 C) 98.1 F (36.7 C)  TempSrc: Oral Oral Oral Oral  SpO2:  98%    Weight:      Height:       General: alert and no distress Lochia: appropriate Uterine Fundus: firm DVT Evaluation: No evidence of DVT seen on physical exam. Negative Homan's sign. Labs: Lab Results  Component Value Date   WBC 11.5 (H) 12/13/2016   HGB 12.2 12/13/2016   HCT  35.1 (L) 12/13/2016   MCV 88.4 12/13/2016   PLT 102 (L) 12/13/2016   No flowsheet data found.  Discharge instruction: per After Visit Summary and "Baby and Me Booklet".  After visit meds:  Allergies as of 12/15/2016   No Known Allergies     Medication List    TAKE these medications   ibuprofen 600 MG tablet Commonly known as:  ADVIL,MOTRIN Take 1 tablet (600 mg total) by mouth every 6 (six) hours.   PRENATAL VITAMIN PO Take 1 tablet by mouth at bedtime.       Diet: routine diet  Activity: Advance as tolerated. Pelvic rest for 6 weeks.   Outpatient follow up:6 weeks Follow up Appt:Future Appointments Date Time Provider Department Center  12/20/2016 4:00 PM [facility] CONSULTANT [facility] None   Follow up Visit:No Follow-up on file.  Postpartum contraception: Combination OCPs  Newborn Data: Live born female  Birth Weight: 7 lb 11.8 oz (3510 g) APGAR: 9, 9  Baby Feeding: Breast Disposition:home with mother   12/15/2016 Jaynie CollinsUgonna Shaina Gullatt, MD

## 2016-12-20 ENCOUNTER — Ambulatory Visit (HOSPITAL_COMMUNITY): Payer: Self-pay

## 2016-12-26 ENCOUNTER — Ambulatory Visit (HOSPITAL_COMMUNITY): Admission: RE | Admit: 2016-12-26 | Payer: Self-pay | Source: Ambulatory Visit

## 2018-03-20 ENCOUNTER — Other Ambulatory Visit: Payer: Self-pay

## 2018-03-20 ENCOUNTER — Emergency Department (HOSPITAL_COMMUNITY)
Admission: EM | Admit: 2018-03-20 | Discharge: 2018-03-20 | Disposition: A | Discharge status: Home / Self Care | Payer: Self-pay | Attending: Emergency Medicine | Admitting: Emergency Medicine

## 2018-03-20 ENCOUNTER — Encounter (HOSPITAL_COMMUNITY): Payer: Self-pay | Admitting: Emergency Medicine

## 2018-03-20 ENCOUNTER — Emergency Department (HOSPITAL_COMMUNITY): Payer: Self-pay

## 2018-03-20 DIAGNOSIS — R1033 Periumbilical pain: Secondary | ICD-10-CM | POA: Insufficient documentation

## 2018-03-20 DIAGNOSIS — L03316 Cellulitis of umbilicus: Secondary | ICD-10-CM | POA: Insufficient documentation

## 2018-03-20 DIAGNOSIS — Z79899 Other long term (current) drug therapy: Secondary | ICD-10-CM | POA: Insufficient documentation

## 2018-03-20 LAB — URINALYSIS, ROUTINE W REFLEX MICROSCOPIC
Bilirubin Urine: NEGATIVE
Glucose, UA: NEGATIVE mg/dL
Hgb urine dipstick: NEGATIVE
Ketones, ur: NEGATIVE mg/dL
Nitrite: NEGATIVE
PH: 5 (ref 5.0–8.0)
Protein, ur: NEGATIVE mg/dL
SPECIFIC GRAVITY, URINE: 1.03 (ref 1.005–1.030)

## 2018-03-20 LAB — COMPREHENSIVE METABOLIC PANEL
ALBUMIN: 4.3 g/dL (ref 3.5–5.0)
ALK PHOS: 88 U/L (ref 38–126)
ALT: 17 U/L (ref 14–54)
AST: 21 U/L (ref 15–41)
Anion gap: 8 (ref 5–15)
BILIRUBIN TOTAL: 0.8 mg/dL (ref 0.3–1.2)
BUN: 10 mg/dL (ref 6–20)
CALCIUM: 8.8 mg/dL — AB (ref 8.9–10.3)
CO2: 23 mmol/L (ref 22–32)
Chloride: 109 mmol/L (ref 101–111)
Creatinine, Ser: 0.66 mg/dL (ref 0.44–1.00)
GFR calc Af Amer: >60 mL/min (ref >60)
GFR calc non Af Amer: >60 mL/min (ref >60)
GLUCOSE: 100 mg/dL — AB (ref 65–99)
Potassium: 3.7 mmol/L (ref 3.5–5.1)
SODIUM: 140 mmol/L (ref 135–145)
TOTAL PROTEIN: 7.3 g/dL (ref 6.5–8.1)

## 2018-03-20 LAB — CBC WITH DIFFERENTIAL/PLATELET
BASOS ABS: 0 10*3/uL (ref 0.0–0.1)
BASOS PCT: 1 %
EOS PCT: 4 %
Eosinophils Absolute: 0.3 10*3/uL (ref 0.0–0.7)
HEMATOCRIT: 39.5 % (ref 36.0–46.0)
Hemoglobin: 13 g/dL (ref 12.0–15.0)
LYMPHS PCT: 44 %
Lymphs Abs: 2.7 10*3/uL (ref 0.7–4.0)
MCH: 29.2 pg (ref 26.0–34.0)
MCHC: 32.9 g/dL (ref 30.0–36.0)
MCV: 88.8 fL (ref 78.0–100.0)
MONO ABS: 0.4 10*3/uL (ref 0.1–1.0)
MONOS PCT: 6 %
NEUTROS ABS: 2.8 10*3/uL (ref 1.7–7.7)
Neutrophils Relative %: 45 %
PLATELETS: 132 10*3/uL — AB (ref 150–400)
RBC: 4.45 MIL/uL (ref 3.87–5.11)
RDW: 13 % (ref 11.5–15.5)
WBC: 6.1 10*3/uL (ref 4.0–10.5)

## 2018-03-20 LAB — POC URINE PREG, ED: PREG TEST UR: NEGATIVE

## 2018-03-20 LAB — I-STAT CG4 LACTIC ACID, ED: Lactic Acid, Venous: 1.58 mmol/L (ref 0.5–1.9)

## 2018-03-20 MED ORDER — IOPAMIDOL (ISOVUE-300) INJECTION 61%
INTRAVENOUS | Status: AC
  Administered 2018-03-20: 100 mL
  Filled 2018-03-20: qty 100

## 2018-03-20 MED ORDER — ACETAMINOPHEN 500 MG PO TABS
1000.0000 mg | ORAL_TABLET | Freq: Once | ORAL | Status: AC
  Administered 2018-03-20: 1000 mg via ORAL
  Filled 2018-03-20: qty 2

## 2018-03-20 MED ORDER — ONDANSETRON HCL 4 MG PO TABS: 4.0000 mg | ORAL_TABLET | Freq: Three times a day (TID) | ORAL | 0 refills | Status: DC | PRN

## 2018-03-20 MED ORDER — CEPHALEXIN 500 MG PO CAPS: 500.0000 mg | ORAL_CAPSULE | Freq: Four times a day (QID) | ORAL | 0 refills | Status: AC

## 2018-03-20 NOTE — ED Provider Notes (Signed)
[facility] EMERGENCY DEPARTMENT Provider Note   CSN: [id number] Arrival date & time: 03/20/18  0815     History   Chief Complaint Chief Complaint  Patient presents with  . Abdominal Pain    HPI Bethany Brewer is a 32 y.o. female.  The history is provided by the patient. The history is limited by a language barrier. A language interpreter was used.  Abdominal Pain   This is a recurrent problem. The current episode started more than 1 week ago. The problem occurs constantly. The problem has not changed since onset.The pain is associated with an unknown factor. The pain is located in the periumbilical region. The quality of the pain is sharp. The pain is moderate. Associated symptoms include nausea. Pertinent negatives include anorexia, fever, belching, diarrhea, flatus, melena, vomiting, constipation and headaches. The symptoms are aggravated by palpation. Nothing relieves the symptoms. Past medical history comments: prior abdominal abscess s/p surgery.    Past Medical History:  Diagnosis Date  . Medical history non-contributory     Patient Active Problem List   Diagnosis Date Noted  . Normal labor 12/13/2016  . [redacted] weeks gestation of pregnancy   . 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 CYST REMOVAL TRUNK       OB History    Gravida  4   Para  2   Term  2   Preterm  0   AB  2   Living  1     SAB  1   TAB  1   Ectopic      Multiple  0   Live Births  2            Home Medications    Prior to Admission medications   Medication Sig Start Date End Date Taking? Authorizing Provider  ibuprofen (ADVIL,MOTRIN) 600 MG tablet Take 1 tablet (600 mg total) by mouth every 6 (six) hours. 12/15/16   Anyanwu, Jethro Bastos, MD  Prenatal Vit-Fe Fumarate-FA (PRENATAL VITAMIN PO) Take 1 tablet by mouth at bedtime.     [provider]     Family History Family History  Problem Relation Age of Onset  . Birth defects Son        polydactyly, bump on skull, small lungs    Social History Social History   Tobacco Use  . Smoking status: Never Smoker  . Smokeless tobacco: Never Used  Substance Use Topics  . Alcohol use: No  . Drug use: No     Allergies   Patient has no known allergies.   Review of Systems Review of Systems  Constitutional: Negative for chills, diaphoresis, fatigue and fever.  HENT: Negative for congestion.   Respiratory: Negative for cough, chest tightness, shortness of breath, wheezing and stridor.   Cardiovascular: Negative for chest pain, palpitations and leg swelling.  Gastrointestinal: Positive for abdominal pain and nausea. Negative for anorexia, constipation, diarrhea, flatus, melena and vomiting.  Genitourinary: Negative for enuresis and flank pain.  Musculoskeletal: Negative for back pain, neck pain and neck stiffness.  Skin: Negative for rash and wound.  Neurological: Negative for light-headedness and headaches.  Psychiatric/Behavioral: Negative for agitation and behavioral problems.  All other systems reviewed and are negative.    Physical Exam Updated Vital Signs BP 130/80   Pulse 81   Temp 98.4 F (36.9 C) (Oral)   Resp 12   Ht 4\' 11"  (1.499 m)  Wt 70.3 kg (155 lb)   SpO2 100%   Breastfeeding? Yes   BMI 31.31 kg/m   Physical Exam  Constitutional: She appears well-developed and well-nourished. No distress.  HENT:  Head: Normocephalic and atraumatic.  Mouth/Throat: Oropharynx is clear and moist. No oropharyngeal exudate.  Eyes: Pupils are equal, round, and reactive to light. Conjunctivae and EOM are normal.  Neck: Normal range of motion.  Cardiovascular: Normal rate.  No murmur heard. Pulmonary/Chest: Effort normal and breath sounds normal. No stridor. No respiratory distress. She has no wheezes. She has no rales. She exhibits no tenderness.  Abdominal: Soft.  Normal appearance and bowel sounds are normal. She exhibits no distension and no mass. There is tenderness. There is no rebound.    Musculoskeletal: She exhibits no edema.  Lymphadenopathy:    She has no cervical adenopathy.  Neurological: She is alert. No sensory deficit. She exhibits normal muscle tone.  Skin: Capillary refill takes less than 2 seconds. No rash noted. She is not diaphoretic. No erythema.  Psychiatric: She has a normal mood and affect.  Nursing note and vitals reviewed.    ED Treatments / Results  Labs (all labs ordered are listed, but only abnormal results are displayed) Labs Reviewed  COMPREHENSIVE METABOLIC PANEL - Abnormal; Notable for the following components:      Result Value   Glucose, Bld 100 (*)    Calcium 8.8 (*)    All other components within normal limits  CBC WITH DIFFERENTIAL/PLATELET - Abnormal; Notable for the following components:   Platelets 132 (*)    All other components within normal limits  URINALYSIS, ROUTINE W REFLEX MICROSCOPIC - Abnormal; Notable for the following components:   APPearance CLOUDY (*)    Leukocytes, UA SMALL (*)    Bacteria, UA RARE (*)    Squamous Epithelial / LPF TOO NUMEROUS TO COUNT (*)    All other components within normal limits  I-STAT CG4 LACTIC ACID, ED  POC URINE PREG, ED    EKG None  Radiology Ct Abdomen Pelvis W Contrast  Result Date: 03/20/2018 CLINICAL DATA:  Umbilical drainage. EXAM: CT ABDOMEN AND PELVIS WITH CONTRAST TECHNIQUE: Multidetector CT imaging of the abdomen and pelvis was performed using the standard protocol following bolus administration of intravenous contrast. CONTRAST:  100mL ISOVUE-300 IOPAMIDOL (ISOVUE-300) INJECTION 61% COMPARISON:  None. FINDINGS: Lower chest: No acute abnormality. Hepatobiliary: No focal liver abnormality is seen. No gallstones, gallbladder wall thickening, or biliary dilatation. Pancreas: Unremarkable. No pancreatic ductal dilatation or surrounding inflammatory  changes. Spleen: Normal in size without focal abnormality. Adrenals/Urinary Tract: Adrenal glands are unremarkable. Kidneys are normal, without renal calculi, focal lesion, or hydronephrosis. Bladder is unremarkable. Stomach/Bowel: Stomach is within normal limits. Appendix appears normal. No evidence of bowel wall thickening, distention, or inflammatory changes. Vascular/Lymphatic: No significant vascular findings are present. No enlarged abdominal or pelvic lymph nodes. Reproductive: Uterus and bilateral adnexa are unremarkable. Other: No abdominal wall hernia or abnormality. No abdominopelvic ascites. Musculoskeletal: No acute or significant osseous findings. IMPRESSION: No abnormality seen in the abdomen or pelvis. Electronically Signed   By: Lupita RaiderJames  Green Jr, M.D.   On: 03/20/2018 14:56    Procedures Procedures (including critical care time)  Medications Ordered in ED Medications  acetaminophen (TYLENOL) tablet 1,000 mg (1,000 mg Oral Given 03/20/18 1318)  iopamidol (ISOVUE-300) 61 % injection (100 mLs  Contrast Given 03/20/18 1437)     Initial Impression / Assessment and Plan / ED Course  I have reviewed the triage vital signs  and the nursing notes.  Pertinent labs & imaging results that were available during my care of the patient were reviewed by me and considered in my medical decision making (see chart for details).     Charissa Knowles is a 32 y.o. female with a past medical history of periumbilical abscess who presents with umbilical pain, purulence, and drainage.  Patient reports that years ago she had an infection/abscess around her umbilicus that had to be drained and removed.  Patient reports that until 2 months ago she had no further symptoms.  She reports that 2 months ago it began draining a clear liquid.  She reports that 2 weeks ago started having increased pain and then started having a purulent appearing discharge.  She reports some nausea but no vomiting.  She denies other  complaints.  She also denies any discharge or vaginal bleeding.   On exam, patient had periumbilical tenderness and tenderness with palpation and probing of the bellybutton.  Bowel sounds were normal otherwise.  Lungs clear and chest nontender.  Exam otherwise unremarkable.  Due to history of abdominal abscess and a report of umbilical purulence, decision made to get labs and CT scan.  Workup was overall reassuring.  No evidence of abscess or fistula present.  Given the reported purulence and the tenderness with deep palpation of the umbilicus, suspect a deep cellulitis inside the umbilicus.  Patient will be given prescription for Keflex as well as nausea medication.  Patient will follow up with PCP but was felt to be stable for discharge home.  Patient had no other questions or concerns and was discharged in good condition with understanding of return precautions and plan of care.  An interpreter was used for all conversations.         Final Clinical Impressions(s) / ED Diagnoses   Final diagnoses:  Periumbilical abdominal pain  Cellulitis of umbilicus    ED Discharge Orders        Ordered    cephALEXin (KEFLEX) 500 MG capsule  4 times daily     03/20/18 1539    ondansetron (ZOFRAN) 4 MG tablet  Every 8 hours PRN     03/20/18 1539      Clinical Impression: 1. Periumbilical abdominal pain   2. Cellulitis of umbilicus     Disposition: Discharge  Condition: Good  I have discussed the results, Dx and Tx plan with the pt(& family if present). He/she/they expressed understanding and agree(s) with the plan. Discharge instructions discussed at great length. Strict return precautions discussed and pt &/or family have verbalized understanding of the instructions. No further questions at time of discharge.    Discharge Medication List as of 03/20/2018  3:41 PM    START taking these medications   Details  cephALEXin (KEFLEX) 500 MG capsule Take 1 capsule (500 mg total) by mouth 4  (four) times daily for 7 days., Starting Thu 03/20/2018, Until Thu 03/27/2018, Print    ondansetron (ZOFRAN) 4 MG tablet Take 1 tablet (4 mg total) by mouth every 8 (eight) hours as needed for nausea or vomiting., Starting Thu 03/20/2018, Print        Follow Up: [facility] [address] [place] [address] [phone] Schedule an appointment as soon as possible for a visit    [facility] EMERGENCY DEPARTMENT [address] [id number] mc [place] [address] [phone]       Malva Diesing, Canary Brim, MD 03/20/18 [phone]

## 2018-03-20 NOTE — ED Notes (Signed)
ED Provider at bedside. 

## 2018-03-20 NOTE — ED Notes (Signed)
Patient transported to CT 

## 2018-03-20 NOTE — ED Notes (Signed)
Asked pt for a urine sample, pt stated she doesn't need to go at this time.

## 2018-03-20 NOTE — ED Triage Notes (Signed)
Patient complains of purulent drainage from her belly button that started two months ago, similar issue in 2005 and she underwent surgery to drain the puss from the area. Drainage was initially small volume and clear, but has increased in volume, became white, and foul smelling. Alert and oriented and in no apparent distress at this time.

## 2018-03-20 NOTE — Discharge Instructions (Signed)
Your CT scan was overall reassuring with no evidence of abscess or intra-abdominal pathology.  Given the drainage and her history of infection in your umbilicus, will get a prescription for antibiotics.  Please take the nausea medicine to help with your nausea and the antibiotic to treat the infection.  I spoke with pharmacy that these medicines would be safe for your breast-feeding.  If any symptoms change or worsen, please return to the nearest emergency department.  Please follow-up with your primary care physician for reassessment.

## 2018-10-03 IMAGING — CT CT ABD-PELV W/ CM
2 of 4 series · 17 of 46 positions shown, 19 images · IV contrast (iopamidol)
Comparison: None.

CLINICAL DATA: Umbilical drainage.

EXAM:
CT ABDOMEN AND PELVIS WITH CONTRAST
TECHNIQUE: Multidetector CT imaging of the abdomen and pelvis was performed
using the standard protocol following bolus administration of
intravenous contrast.
CONTRAST:  100mL [id number]-[id number] IOPAMIDOL ([id number]-ZZZ) INJECTION 61%

[Series 3: a/p w/ 5mm · axial · 0.82mm/px · z∈[+931,+1326]mm · 14 of 87 slices shown, 16 images]
[im 4/87  soft-tissue]
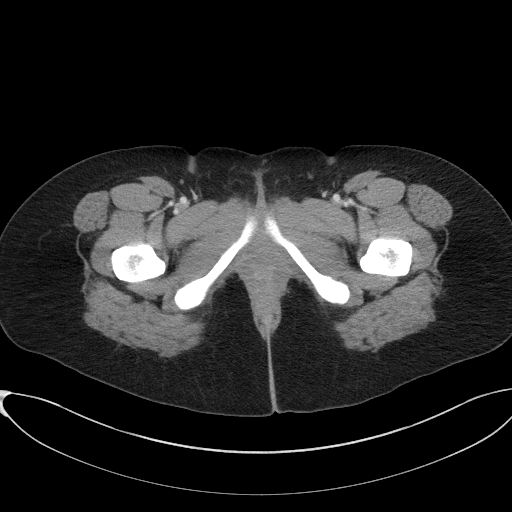
[im 4/87  bone]
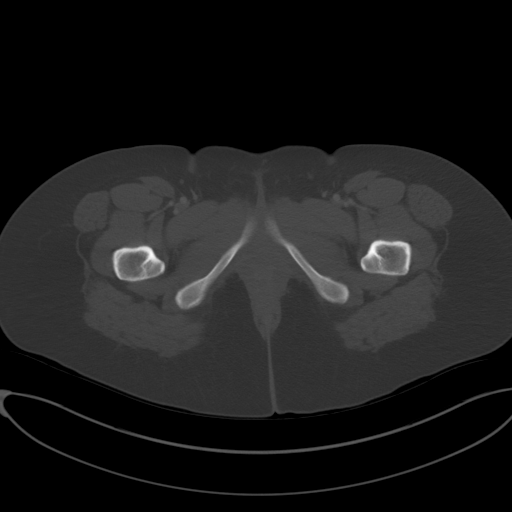
[im 12/87  soft-tissue]
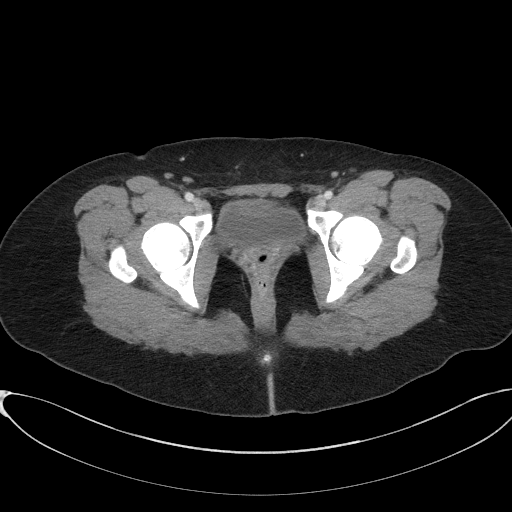
[im 15/87  soft-tissue]
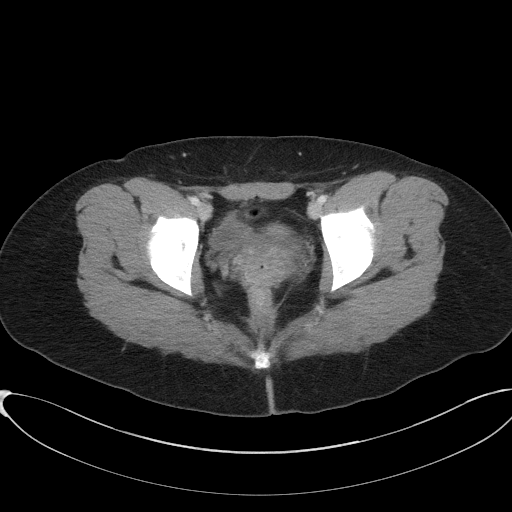
[im 23/87  soft-tissue]
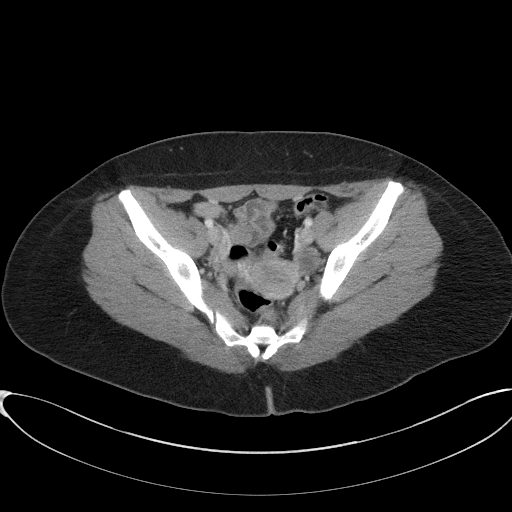
[im 30/87  soft-tissue]
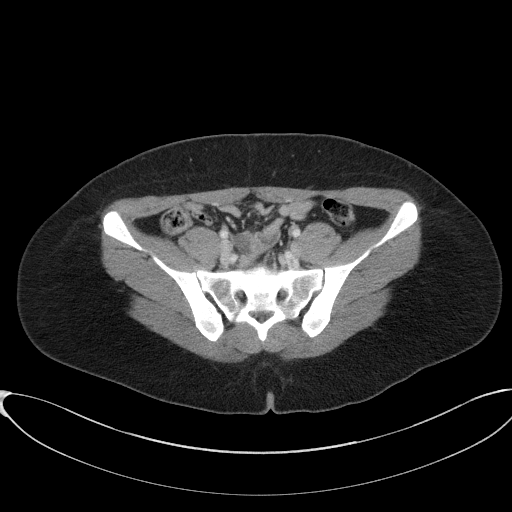
[im 34/87  soft-tissue]
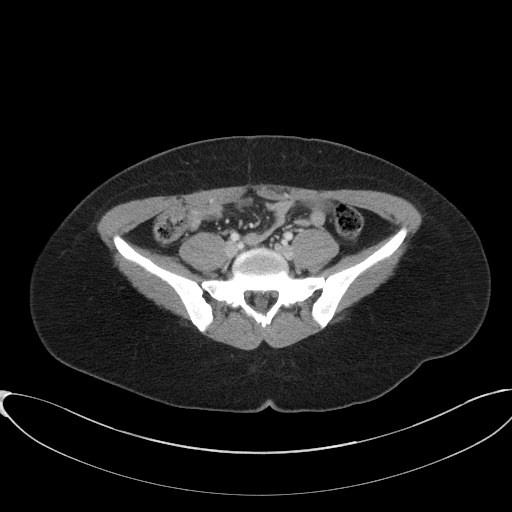
[im 42/87  soft-tissue]
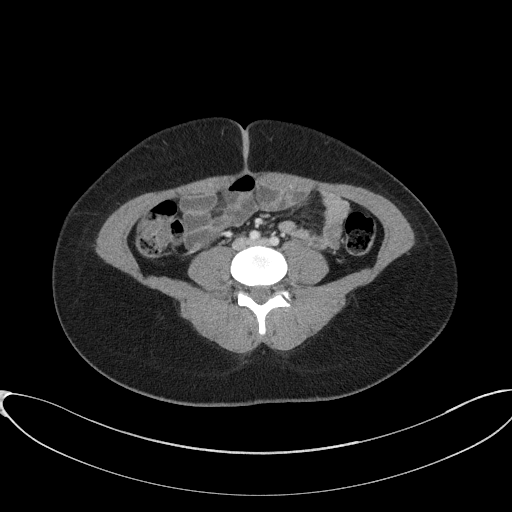
[im 45/87  soft-tissue]
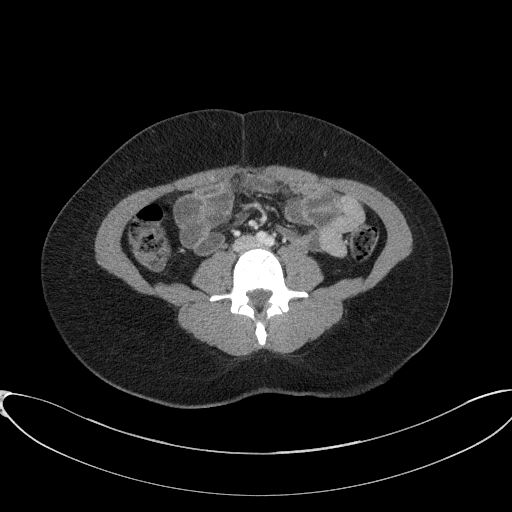
[im 53/87  soft-tissue]
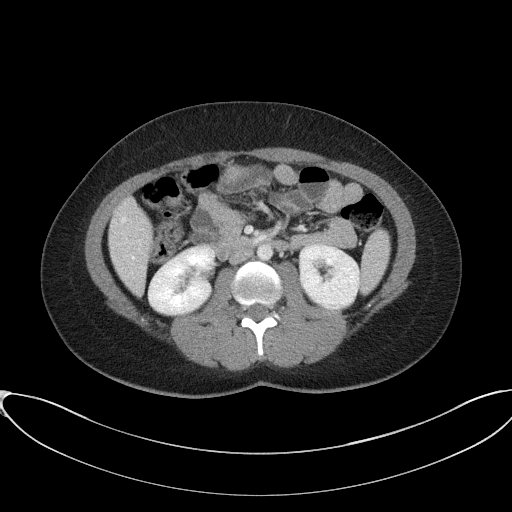
[im 53/87  bone]
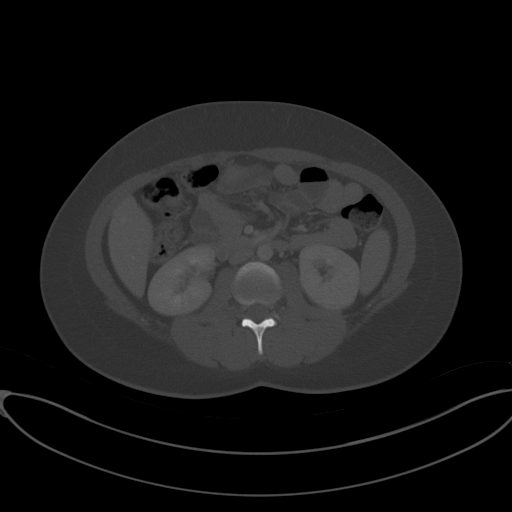
[im 57/87  soft-tissue]
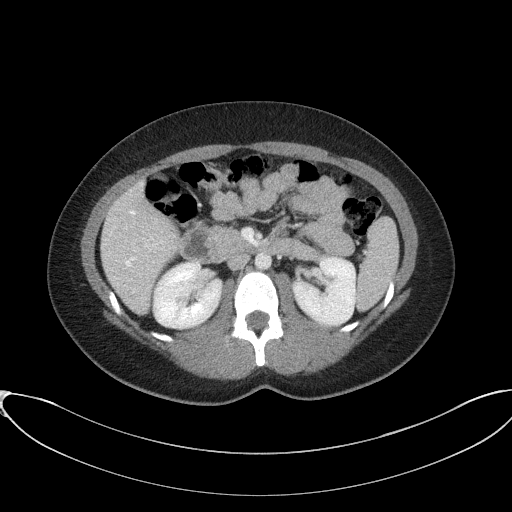
[im 64/87  soft-tissue]
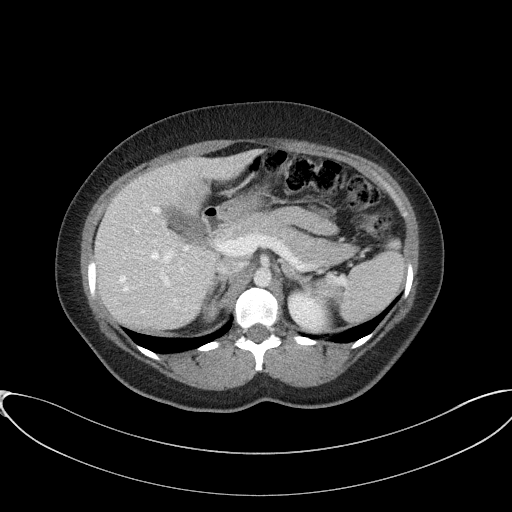
[im 72/87  soft-tissue]
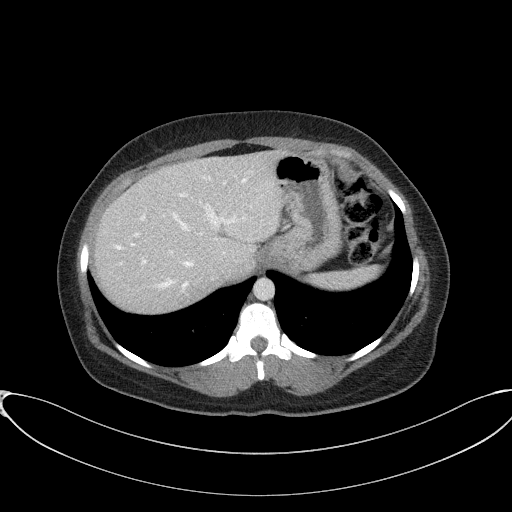
[im 75/87  soft-tissue]
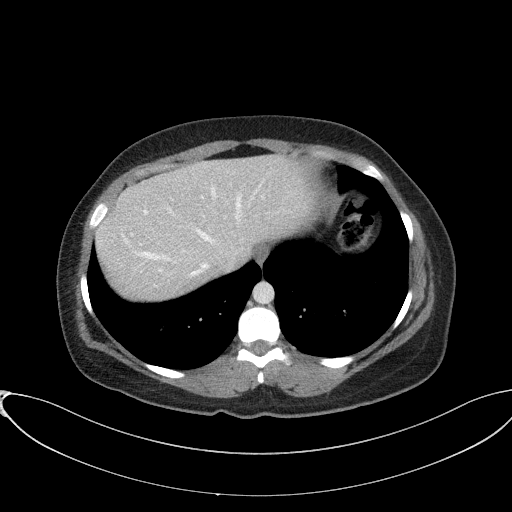
[im 83/87  soft-tissue]
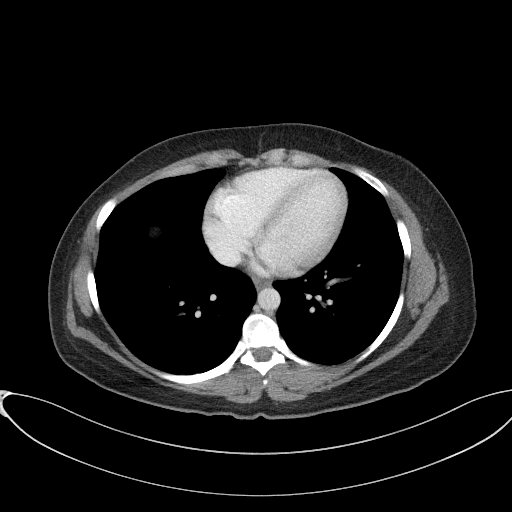

[Series 6: a/p w/ cor · coronal · 0.79mm/px · 3 of 122 slices shown]
[im 41/122  soft-tissue]
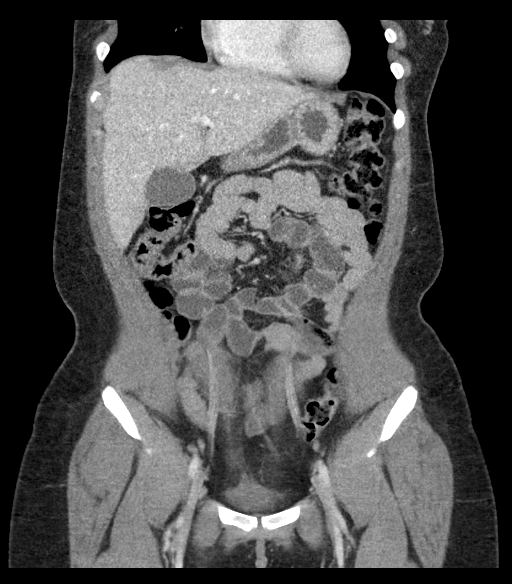
[im 54/122  soft-tissue]
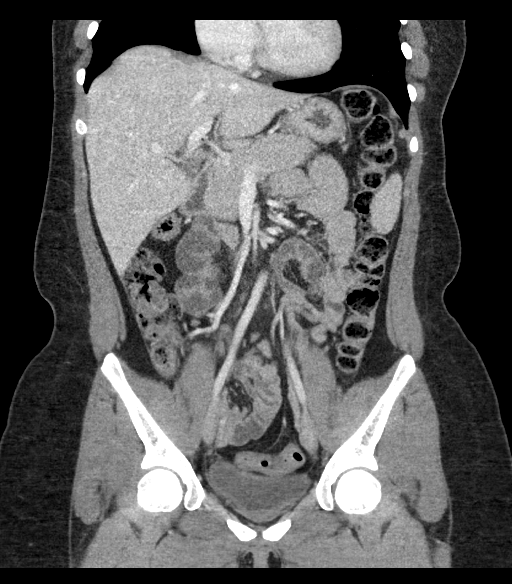
[im 68/122  soft-tissue]
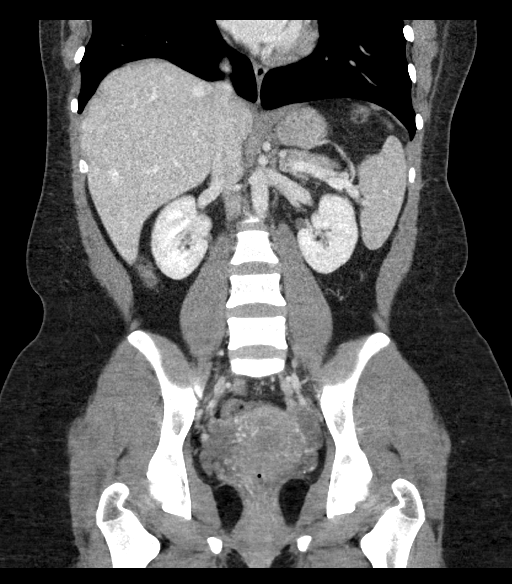

[17 of 46 positions shown; findings below may reference images not displayed]

FINDINGS: Lower chest: No acute abnormality.

Hepatobiliary: No focal liver abnormality is seen. No gallstones,
gallbladder wall thickening, or biliary dilatation.

Pancreas: Unremarkable. No pancreatic ductal dilatation or
surrounding inflammatory changes.

Spleen: Normal in size without focal abnormality.

Adrenals/Urinary Tract: Adrenal glands are unremarkable. Kidneys are
normal, without renal calculi, focal lesion, or hydronephrosis.
Bladder is unremarkable.

Stomach/Bowel: Stomach is within normal limits. Appendix appears
normal. No evidence of bowel wall thickening, distention, or
inflammatory changes.

Vascular/Lymphatic: No significant vascular findings are present. No
enlarged abdominal or pelvic lymph nodes.

Reproductive: Uterus and bilateral adnexa are unremarkable.

Other: No abdominal wall hernia or abnormality. No abdominopelvic
ascites.

Musculoskeletal: No acute or significant osseous findings.
IMPRESSION: No abnormality seen in the abdomen or pelvis.

## 2019-12-25 NOTE — L&D Delivery Note (Addendum)
Patient is 34 y.o. [id number] [redacted]w[redacted]d admitted for SROM.  Delivery Note At 9:05 AM a viable female was delivered via Vaginal, Spontaneous (Presentation: Right Occiput Anterior).  APGAR: 9, 9; weight  pending.   Placenta status: Spontaneous, Intact.  Cord: 3   Anesthesia: None Episiotomy: None Lacerations: 6mm periurethral laceration, hemostatic, no repair Suture Repair:  none Est. Blood Loss (mL): 300 ml  Mom to postpartum.  Baby to Couplet care / Skin to Skin.  Upon arrival patient was complete and pushing. She pushed with good maternal effort to deliver a healthy baby girl. Baby delivered without difficulty, was noted to have good tone and place on maternal abdomen for oral suctioning, drying and stimulation. Delayed cord clamping performed. Placenta delivered intact with 3V cord. Vaginal canal and perineum was inspected and found to have a homostatic periurethral laceration with no other lacerations. Pitocin was started and uterus massaged until bleeding slowed. Counts of sharps, instruments, and lap pads were all correct.     Mirian Mo, MD PGY-3 8/19/20219:38 AM  I was gloved and present for the delivery of the baby and placenta.  Sheila Oats, MD [facility], [facility] 08/11/2020 9:45 AM

## 2020-01-04 LAB — PREGNANCY, URINE: Preg Test, Ur: POSITIVE

## 2020-02-10 ENCOUNTER — Encounter: Payer: Self-pay | Admitting: *Deleted

## 2020-02-10 ENCOUNTER — Other Ambulatory Visit: Payer: Self-pay

## 2020-02-10 ENCOUNTER — Ambulatory Visit (INDEPENDENT_AMBULATORY_CARE_PROVIDER_SITE_OTHER): Payer: Self-pay | Admitting: *Deleted

## 2020-02-10 ENCOUNTER — Telehealth: Payer: Self-pay | Admitting: Obstetrics and Gynecology

## 2020-02-10 DIAGNOSIS — Z3687 Encounter for antenatal screening for uncertain dates: Secondary | ICD-10-CM

## 2020-02-10 DIAGNOSIS — O0990 Supervision of high risk pregnancy, unspecified, unspecified trimester: Secondary | ICD-10-CM

## 2020-02-10 DIAGNOSIS — Z789 Other specified health status: Secondary | ICD-10-CM

## 2020-02-10 DIAGNOSIS — O09299 Supervision of pregnancy with other poor reproductive or obstetric history, unspecified trimester: Secondary | ICD-10-CM

## 2020-02-10 NOTE — Progress Notes (Signed)
1:09 pm I called Health department and left message for them to send her pregnancy confirmation. Kindra Bickham,RN

## 2020-02-10 NOTE — Progress Notes (Signed)
   Scheryl Darter, MD 02/10/2020 2:39 PM  Patient ID: Bethany Brewer, female   DOB: Nov 19, 1986, 34 y.o.   MRN: [id number]   Patient seen and assessed by nursing staff during this encounter. I have reviewed the chart and agree with the documentation and plan.

## 2020-02-10 NOTE — Telephone Encounter (Signed)
Unable to double book at the present time. Placed the patient on the schedule to be seen after U/S.

## 2020-02-10 NOTE — Patient Instructions (Signed)

## 2020-02-10 NOTE — Progress Notes (Signed)
I connected with  Bethany Brewer on 02/10/20 at  8:30 AM EST by telephone and verified that I am speaking with the correct person using two identifiers.   I discussed the limitations, risks, security and privacy concerns of performing an evaluation and management service by telephone and the availability of in person appointments. I also discussed with the patient that there may be a patient responsible charge related to this service. The patient expressed understanding and agreed to proceed.  I explained I am completing her New OB Intake today. We discussed Her EDD and that it is based on  sure LMP. She reports she had pregnancy confirmation at [facility] department and LMP was 11/08/19 and it was like a normal period and lasted 6-7 days. . States had 2 periods in November. First one was 10/25/19 and it was normal lasting 6-7 days. . States usually only has 1 period a month.  I will contact them for her records. . I reviewed her allergies, meds, OB History, Medical /Surgical history, and appropriate screenings. She confirms she was exposed to cat litter prior to pregnancy.   I explained we will discuss with her if we will order a blood pressure cuff when she is in the office- depends on her medicaid. I  Explained  then we will have her take her blood pressure weekly. I explained she will have some visits in office and some virtually. I explained we will help her download an app for her virtual visits  when she is in the office. I reviewed her new ob  appointment date/ time with her , our location and to wear mask, no visitors.  I explained she will have a pelvic exam, ob bloodwork, hemoglobin a1C, cbg ,pap, and  genetic testing if desired,- she is unsure if she wants a panorama. I scheduled a dating  [place] and gave her the appointment. She voices understanding.   Adreena Willits,RN 02/10/2020  8:37 AM

## 2020-02-16 ENCOUNTER — Other Ambulatory Visit: Payer: Self-pay | Admitting: Obstetrics & Gynecology

## 2020-02-16 ENCOUNTER — Ambulatory Visit (INDEPENDENT_AMBULATORY_CARE_PROVIDER_SITE_OTHER): Payer: Self-pay

## 2020-02-16 ENCOUNTER — Other Ambulatory Visit: Payer: Self-pay

## 2020-02-16 ENCOUNTER — Ambulatory Visit (HOSPITAL_COMMUNITY)
Admission: RE | Admit: 2020-02-16 | Discharge: 2020-02-16 | Disposition: A | Discharge status: Home / Self Care | Payer: Self-pay | Source: Ambulatory Visit | Attending: Obstetrics & Gynecology | Admitting: Obstetrics & Gynecology

## 2020-02-16 ENCOUNTER — Ambulatory Visit: Payer: Self-pay

## 2020-02-16 DIAGNOSIS — Z3687 Encounter for antenatal screening for uncertain dates: Secondary | ICD-10-CM

## 2020-02-16 DIAGNOSIS — Z712 Person consulting for explanation of examination or test findings: Secondary | ICD-10-CM

## 2020-02-16 NOTE — Progress Notes (Signed)
Pt here following [place] for unsure LMP. Results reviewed with Luna Kitchens, CNM who finds single-living IUP that is 13w 6d with EDD of 08/17/20. Recommends pt continue prenatal care as scheduled. Results and pictures given to pt with interpreter Raquel. Medications and allergies reviewed; list of medications safe to take during pregnancy given to pt. Pt will follow up at new OB appt tomorrow.  Fleet Contras RN 02/16/20

## 2020-02-17 ENCOUNTER — Encounter: Payer: Self-pay | Admitting: Obstetrics & Gynecology

## 2020-02-17 ENCOUNTER — Other Ambulatory Visit (HOSPITAL_COMMUNITY): Admission: RE | Admit: 2020-02-17 | Payer: Self-pay | Source: Ambulatory Visit | Admitting: Obstetrics & Gynecology

## 2020-02-17 ENCOUNTER — Ambulatory Visit (INDEPENDENT_AMBULATORY_CARE_PROVIDER_SITE_OTHER): Payer: Self-pay | Admitting: Obstetrics & Gynecology

## 2020-02-17 VITALS — BP 101/64 | HR 69 | Wt 175.6 lb

## 2020-02-17 DIAGNOSIS — Z789 Other specified health status: Secondary | ICD-10-CM

## 2020-02-17 DIAGNOSIS — O0990 Supervision of high risk pregnancy, unspecified, unspecified trimester: Secondary | ICD-10-CM

## 2020-02-17 DIAGNOSIS — Z3A14 14 weeks gestation of pregnancy: Secondary | ICD-10-CM

## 2020-02-17 DIAGNOSIS — O09299 Supervision of pregnancy with other poor reproductive or obstetric history, unspecified trimester: Secondary | ICD-10-CM

## 2020-02-17 DIAGNOSIS — O09292 Supervision of pregnancy with other poor reproductive or obstetric history, second trimester: Secondary | ICD-10-CM

## 2020-02-17 DIAGNOSIS — O0992 Supervision of high risk pregnancy, unspecified, second trimester: Secondary | ICD-10-CM

## 2020-02-17 MED ORDER — DOCUSATE SODIUM 100 MG PO CAPS: 100.0000 mg | ORAL_CAPSULE | Freq: Two times a day (BID) | ORAL | 2 refills | Status: DC | PRN

## 2020-02-17 NOTE — Progress Notes (Signed)
  Subjective:spanish interpreter    Bethany Brewer is a [id number] [redacted]w[redacted]d being seen today for her first obstetrical visit.  Her obstetrical history is significant for previous fetal anomalies. Patient does intend to breast feed. Pregnancy history fully reviewed.  Patient reports nausea and constipation.  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 Medical history non-contributory    Past Surgical History:  Procedure Laterality Date  . CYST REMOVAL TRUNK  2005   states was near belly button   Family History  Problem Relation Age of Onset  . Birth defects Son        polydactyly, bump on skull, small lungs     Exam    Uterus:     Pelvic Exam:    Perineum: No Hemorrhoids   Vulva: normal   Vagina:  normal mucosa   pH:     Cervix: no lesions   Adnexa: normal adnexa   Bony Pelvis: average  System: Breast:  normal appearance, no masses or tenderness   Skin: normal coloration and turgor, no rashes    Neurologic: oriented, normal mood   Extremities: normal strength, tone, and muscle mass   HEENT PERRLA and neck supple with midline trachea   Mouth/Teeth dental hygiene good   Neck supple   Cardiovascular: regular rate and rhythm, no murmurs or gallops   Respiratory:  appears well, vitals normal, no respiratory distress, acyanotic, normal RR, neck free of mass or  lymphadenopathy, chest clear, no wheezing, crepitations, rhonchi, normal symmetric air entry   Abdomen: soft, non-tender; bowel sounds normal; no masses,  no organomegaly   Urinary: urethral meatus normal      Assessment:    Pregnancy: 2006 Patient Active Problem List   Diagnosis Date Noted  . Supervision of high risk pregnancy, antepartum 02/10/2020  . Language barrier 02/10/2020  . 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 Prenatal vitamins. Problem list reviewed and updated. Genetic Screening discussed and declined due to cost not covered  Ultrasound discussed; fetal survey: ordered.  Follow up in 4 weeks. 50% of 30 min visit spent on counseling and coordination of care.  Colace for constipation, she has this medication   09/22/2016 02/17/2020

## 2020-02-17 NOTE — Patient Instructions (Signed)
Segundo trimestre de embarazo Second Trimester of Pregnancy  El segundo trimestre va desde la semana14 hasta la 27 (desde el mes 4 hasta el 6). Este suele ser el momento en el que mejor se siente. En general, las nuseas matutinas han disminuido o han desaparecido completamente. Tendr ms energa y podr aumentarle el apetito. El beb en gestacin se desarrolla rpidamente. Hacia el final del sexto mes, el beb mide aproximadamente 9 pulgadas (23 cm) y pesa alrededor de 1 libras (700 g). Es probable que sienta al beb moverse entre las 18 y 20 semanas del embarazo. Siga estas indicaciones en su casa: Medicamentos  Tome los medicamentos de venta libre y los recetados solamente como se lo haya indicado el mdico. Algunos medicamentos son seguros para tomar durante el embarazo y otros no lo son.  Tome vitaminas prenatales que contengan por lo menos 600microgramos (?g) de cido flico.  Si tiene dificultad para mover el intestino (estreimiento), tome un medicamento para ablandar las heces (laxante) si su mdico se lo autoriza. Comida y bebida   Ingiera alimentos saludables de manera regular.  No coma carne cruda ni quesos sin cocinar.  Si obtiene poca cantidad de calcio de los alimentos que ingiere, consulte a su mdico sobre la posibilidad de tomar un suplemento diario de calcio.  Evite el consumo de alimentos ricos en grasas y azcares, como los alimentos fritos y los dulces.  Si tiene malestar estomacal (nuseas) o devuelve (vomita): ? Ingiera 4 o 5comidas pequeas por da en lugar de 3abundantes. ? Intente comer algunas galletitas saladas. ? Beba lquidos entre las comidas, en lugar de hacerlo durante estas.  Para evitar el estreimiento: ? Consuma alimentos ricos en fibra, como frutas y verduras frescas, cereales integrales y frijoles. ? Beba suficiente lquido para mantener el pis (orina) claro o de color amarillo plido. Actividad  Haga ejercicios solamente como se lo haya  indicado el mdico. Interrumpa la actividad fsica si comienza a tener calambres.  No haga ejercicio si hace demasiado calor, hay demasiada humedad o se encuentra en un lugar de mucha altura (altitud alta).  Evite levantar pesos excesivos.  Use zapatos con tacones bajos. Mantenga una buena postura al sentarse y pararse.  Puede continuar teniendo relaciones sexuales, a menos que el mdico le indique lo contrario. Alivio del dolor y del malestar  Use un sostn que le brinde buen soporte si sus mamas estn sensibles.  Dese baos de asiento con agua tibia para aliviar el dolor o las molestias causadas por las hemorroides. Use una crema para las hemorroides si el mdico la autoriza.  Descanse con las piernas elevadas si tiene calambres o dolor de cintura.  Si desarrolla venas hinchadas y abultadas (vrices) en las piernas: ? Use medias de compresin o medias de descanso como se lo haya indicado el mdico. ? Levante (eleve) los pies durante 15minutos, 3 o 4veces por da. ? Limite el consumo de sal en sus alimentos. Cuidado prenatal  Escriba sus preguntas. Llvelas cuando concurra a las visitas prenatales.  Concurra a todas las visitas prenatales como se lo haya indicado el mdico. Esto es importante. Seguridad  Colquese el cinturn de seguridad cuando conduzca.  Haga una lista de los nmeros de telfono de emergencia, que incluya los nmeros de telfono de familiares, amigos, el hospital, as como los departamentos de polica y bomberos. Instrucciones generales  Consulte a su mdico sobre los alimentos que debe comer o pdale que la ayude a encontrar a quien pueda aconsejarla si necesita ese servicio.    Consulte a su mdico acerca de dnde se dictan clases prenatales cerca de donde vive. Comience las clases antes del mes 6 de embarazo.  No se d baos de inmersin en agua caliente, baos turcos ni saunas.  No se haga duchas vaginales ni use tampones o toallas higinicas perfumadas.   No mantenga las piernas cruzadas durante mucho tiempo.  Vaya al dentista si an no lo hizo. Use un cepillo de cerdas suaves para cepillarse los dientes. Psese el hilo dental suavemente.  No fume, no consuma hierbas ni beba alcohol. No tome frmacos que el mdico no haya autorizado.  No consuma ningn producto que contenga nicotina o tabaco, como cigarrillos y cigarrillos electrnicos. Si necesita ayuda para dejar de fumar, consulte al mdico.  Evite el contacto con las bandejas sanitarias de los gatos y la tierra que estos animales usan. Estos elementos contienen bacterias que pueden causar defectos congnitos al beb y la posible prdida del beb (aborto espontneo) o la muerte fetal. Comunquese con un mdico si:  Tiene clicos leves o siente presin en la parte baja del vientre.  Tiene dolor al hacer pis (orinar).  Advierte un lquido con olor ftido que proviene de la vagina.  Tiene malestar estomacal (nuseas), devuelve (vomita) o tiene deposiciones acuosas (diarrea).  Sufre un dolor persistente en el abdomen.  Siente mareos. Solicite ayuda de inmediato si:  Tiene fiebre.  Tiene una prdida de lquido por la vagina.  Tiene sangrado o pequeas prdidas vaginales.  Siente dolor intenso o clicos en el abdomen.  Sube o baja de peso rpidamente.  Tiene dificultades para recuperar el aliento y siente dolor en el pecho.  Sbitamente se le hinchan mucho el rostro, las manos, los tobillos, los pies o las piernas.  No ha sentido los movimientos del beb durante una hora.  Siente un dolor de cabeza intenso que no se alivia al tomar medicamentos.  Tiene dificultad para ver. Resumen  El segundo trimestre va desde la semana14 hasta la 27, desde el mes 4 hasta el 6. Este suele ser el momento en el que mejor se siente.  Para cuidarse y cuidar a su beb en gestacin, debe comer alimentos saludables, tomar medicamentos solamente si su mdico le indica que lo haga y hacer  actividades que sean seguras para usted y su beb.  Llame al mdico si se enferma o si nota algo inusual acerca de su embarazo. Tambin llame al mdico si necesita ayuda para saber qu alimentos debe comer o si quiere saber qu actividades puede realizar de forma segura. Esta informacin no tiene como fin reemplazar el consejo del mdico. Asegrese de hacerle al mdico cualquier pregunta que tenga. Document Revised: 09/04/2017 Document Reviewed: 09/04/2017 Elsevier Patient Education  2020 [employer]vier Inc[employer]  

## 2020-02-18 ENCOUNTER — Telehealth: Payer: Self-pay

## 2020-02-18 LAB — OBSTETRIC PANEL, INCLUDING HIV
Antibody Screen: NEGATIVE
Basophils Absolute: 0 10*3/uL (ref 0.0–0.2)
Basos: 0 %
EOS (ABSOLUTE): 0.2 10*3/uL (ref 0.0–0.4)
Eos: 2 %
HIV Screen 4th Generation wRfx: NONREACTIVE
Hematocrit: 35.1 % (ref 34.0–46.6)
Hemoglobin: 12 g/dL (ref 11.1–15.9)
Hepatitis B Surface Ag: NEGATIVE
Immature Grans (Abs): 0 10*3/uL (ref 0.0–0.1)
Immature Granulocytes: 0 %
Lymphocytes Absolute: 2.9 10*3/uL (ref 0.7–3.1)
Lymphs: 29 %
MCH: 30.9 pg (ref 26.6–33.0)
MCHC: 34.2 g/dL (ref 31.5–35.7)
MCV: 91 fL (ref 79–97)
Monocytes Absolute: 0.6 10*3/uL (ref 0.1–0.9)
Monocytes: 6 %
Neutrophils Absolute: 6.1 10*3/uL (ref 1.4–7.0)
Neutrophils: 63 %
Platelets: 233 10*3/uL (ref 150–450)
RBC: 3.88 x10E6/uL (ref 3.77–5.28)
RDW: 12.9 % (ref 11.7–15.4)
RPR Ser Ql: NONREACTIVE
Rh Factor: POSITIVE
Rubella Antibodies, IGG: 2.85 index (ref >0.99)
WBC: 9.8 10*3/uL (ref 3.4–10.8)

## 2020-02-18 LAB — CYTOLOGY - PAP
Chlamydia: NEGATIVE
Comment: NEGATIVE
Comment: NEGATIVE
Comment: NORMAL
Diagnosis: NEGATIVE
High risk HPV: NEGATIVE
Neisseria Gonorrhea: NEGATIVE

## 2020-02-18 LAB — HEMOGLOBIN A1C
Est. average glucose Bld gHb Est-mCnc: 105 mg/dL
Hgb A1c MFr Bld: 5.3 % (ref 4.8–5.6)

## 2020-02-18 NOTE — Telephone Encounter (Addendum)
-----   Message from Adam Phenix, MD sent at 02/18/2020 10:25 AM EST ----- Nl Hb A1c  Notified pt with Spanish Interpreter Eda R., that her results from yesterday came back normal.  Pt verbalized understanding with no further questions.   Addison Naegeli, RN 02/18/20

## 2020-02-20 LAB — CULTURE, OB URINE

## 2020-02-20 LAB — URINE CULTURE, OB REFLEX

## 2020-02-21 NOTE — Progress Notes (Signed)
Chart reviewed for nurse visit. Agree with plan of care.   Shakirra Buehler Lorraine, CNM 02/21/2020 7:40 AM   

## 2020-03-16 ENCOUNTER — Encounter: Payer: Self-pay | Admitting: Physician Assistant

## 2020-03-16 ENCOUNTER — Telehealth (INDEPENDENT_AMBULATORY_CARE_PROVIDER_SITE_OTHER): Payer: Self-pay | Admitting: Family Medicine

## 2020-03-16 ENCOUNTER — Other Ambulatory Visit: Payer: Self-pay

## 2020-03-16 VITALS — BP 109/71 | HR 75

## 2020-03-16 DIAGNOSIS — Z789 Other specified health status: Secondary | ICD-10-CM

## 2020-03-16 DIAGNOSIS — O0990 Supervision of high risk pregnancy, unspecified, unspecified trimester: Secondary | ICD-10-CM

## 2020-03-16 DIAGNOSIS — K921 Melena: Secondary | ICD-10-CM

## 2020-03-16 DIAGNOSIS — O0992 Supervision of high risk pregnancy, unspecified, second trimester: Secondary | ICD-10-CM

## 2020-03-16 DIAGNOSIS — Z3A18 18 weeks gestation of pregnancy: Secondary | ICD-10-CM

## 2020-03-16 NOTE — Progress Notes (Signed)
I connected with  Fara Chute on 03/16/20 at  1:35 PM EDT by telephone with interpreter Eda and verified that I am speaking with the correct person using two identifiers.   I discussed the limitations, risks, security and privacy concerns of performing an evaluation and management service by telephone and the availability of in person appointments. I also discussed with the patient that there may be a patient responsible charge related to this service. The patient expressed understanding and agreed to proceed.   Pt reports yellow vaginal discharge with itching.   Marjo Bicker, RN 03/16/2020  1:47 PM

## 2020-03-16 NOTE — Patient Instructions (Signed)
 Lactancia materna Breastfeeding  Decidir amamantar es una de las mejores elecciones que puede hacer por usted y su beb. Un cambio en las hormonas durante el embarazo hace que las mamas produzcan leche materna en las glndulas productoras de leche. Las hormonas impiden que la leche materna sea liberada antes del nacimiento del beb. Adems, impulsan el flujo de leche luego del nacimiento. Una vez que ha comenzado a amamantar, pensar en el beb, as como la succin o el llanto, pueden estimular la liberacin de leche de las glndulas productoras de leche. Los beneficios de amamantar Las investigaciones demuestran que la lactancia materna ofrece muchos beneficios de salud para bebs y madres. Adems, ofrece una forma gratuita y conveniente de alimentar al beb. Para el beb  La primera leche (calostro) ayuda a mejorar el funcionamiento del aparato digestivo del beb.  Las clulas especiales de la leche (anticuerpos) ayudan a combatir las infecciones en el beb.  Los bebs que se alimentan con leche materna tambin tienen menos probabilidades de tener asma, alergias, obesidad o diabetes de tipo 2. Adems, tienen menor riesgo de sufrir el sndrome de muerte sbita del lactante (SMSL).  Los nutrientes de la leche materna son mejores para satisfacer las necesidades del beb en comparacin con la leche maternizada.  La leche materna mejora el desarrollo cerebral del beb. Para usted  La lactancia materna favorece el desarrollo de un vnculo muy especial entre la madre y el beb.  Es conveniente. La leche materna es econmica y siempre est disponible a la temperatura correcta.  La lactancia materna ayuda a quemar caloras. Le ayuda a perder el peso ganado durante el embarazo.  Hace que el tero vuelva al tamao que tena antes del embarazo ms rpido. Adems, disminuye el sangrado (loquios) despus del parto.  La lactancia materna contribuye a reducir el riesgo de tener diabetes de tipo 2,  osteoporosis, artritis reumatoide, enfermedades cardiovasculares y cncer de mama, ovario, tero y endometrio en el futuro. Informacin bsica sobre la lactancia Comienzo de la lactancia  Encuentre un lugar cmodo para sentarse o acostarse, con un buen respaldo para el cuello y la espalda.  Coloque una almohada o una manta enrollada debajo del beb para acomodarlo a la altura de la mama (si est sentada). Las almohadas para amamantar se han diseado especialmente a fin de servir de apoyo para los brazos y el beb mientras amamanta.  Asegrese de que la barriga del beb (abdomen) est frente a la suya.  Masajee suavemente la mama. Con las yemas de los dedos, masajee los bordes exteriores de la mama hacia adentro, en direccin al pezn. Esto estimula el flujo de leche. Si la leche fluye lentamente, es posible que deba continuar con este movimiento durante la lactancia.  Sostenga la mama con 4 dedos por debajo y el pulgar por arriba del pezn (forme la letra "C" con la mano). Asegrese de que los dedos se encuentren lejos del pezn y de la boca del beb.  Empuje suavemente los labios del beb con el pezn o con el dedo.  Cuando la boca del beb se abra lo suficiente, acrquelo rpidamente a la mama e introduzca todo el pezn y la arola, tanto como sea posible, dentro de la boca del beb. La arola es la zona de color que rodea al pezn. ? Debe haber ms arola visible por arriba del labio superior del beb que por debajo del labio inferior. ? Los labios del beb deben estar abiertos y extendidos hacia afuera (evertidos) para asegurar   que el beb se prenda de forma adecuada y cmoda. ? La lengua del beb debe estar entre la enca inferior y la mama.  Asegrese de que la boca del beb est en la posicin correcta alrededor del pezn (prendido). Los labios del beb deben crear un sello sobre la mama y estar doblados hacia afuera (invertidos).  Es comn que el beb succione durante 2 a 3 minutos  para que comience el flujo de leche materna. Cmo debe prenderse Es muy importante que le ensee al beb cmo prenderse adecuadamente a la mama. Si el beb no se prende adecuadamente, puede causar dolor en los pezones, reducir la produccin de leche materna y hacer que el beb tenga un escaso aumento de peso. Adems, si el beb no se prende adecuadamente al pezn, puede tragar aire durante la alimentacin. Esto puede causarle molestias al beb. Hacer eructar al beb al cambiar de mama puede ayudarlo a liberar el aire. Sin embargo, ensearle al beb cmo prenderse a la mama adecuadamente es la mejor manera de evitar que se sienta molesto por tragar aire mientras se alimenta. Signos de que el beb se ha prendido adecuadamente al pezn  Tironea o succiona de modo silencioso, sin causarle dolor. Los labios del beb deben estar extendidos hacia afuera (evertidos).  Se escucha que traga cada 3 o 4 succiones una vez que la leche ha comenzado a fluir (despus de que se produzca el reflejo de eyeccin de la leche).  Hay movimientos musculares por arriba y por delante de sus odos al succionar. Signos de que el beb no se ha prendido adecuadamente al pezn  Hace ruidos de succin o de chasquido mientras se alimenta.  Siente dolor en los pezones. Si cree que el beb no se prendi correctamente, deslice el dedo en la comisura de la boca y colquelo entre las encas del beb para interrumpir la succin. Intente volver a comenzar a amamantar. Signos de lactancia materna exitosa Signos del beb  El beb disminuir gradualmente el nmero de succiones o dejar de succionar por completo.  El beb se quedar dormido.  El cuerpo del beb se relajar.  El beb retendr una pequea cantidad de leche en la boca.  El beb se desprender solo del pecho. Signos que presenta usted  Las mamas han aumentado la firmeza, el peso y el tamao 1 a 3 horas despus de amamantar.  Estn ms blandas inmediatamente despus  de amamantar.  Se producen un aumento del volumen de leche y un cambio en su consistencia y color hacia el quinto da de lactancia.  Los pezones no duelen, no estn agrietados ni sangran. Signos de que su beb recibe la cantidad de leche suficiente  Mojar por lo menos 1 o 2paales durante las primeras 24horas despus del nacimiento.  Mojar por lo menos 5 o 6paales cada 24horas durante la primera semana despus del nacimiento. La orina debe ser clara o de color amarillo plido a los 5das de vida.  Mojar entre 6 y 8paales cada 24horas a medida que el beb sigue creciendo y desarrollndose.  Defeca por lo menos 3 veces en 24 horas a los 5 das de vida. Las heces deben ser blandas y amarillentas.  Defeca por lo menos 3 veces en 24 horas a los 7 das de vida. Las heces deben ser grumosas y amarillentas.  No registra una prdida de peso mayor al 10% del peso al nacer durante los primeros 3 das de vida.  Aumenta de peso un promedio de 4   a 7onzas (113 a 198g) por semana despus de los 4 das de vida.  Aumenta de peso, diariamente, de manera uniforme a partir de los 5 das de vida, sin registrar prdida de peso despus de las 2semanas de vida. Despus de alimentarse, es posible que el beb regurgite una pequea cantidad de leche. Esto es normal. Frecuencia y duracin de la lactancia El amamantamiento frecuente la ayudar a producir ms leche y puede prevenir dolores en los pezones y las mamas extremadamente llenas (congestin mamaria). Alimente al beb cuando muestre signos de hambre o si siente la necesidad de reducir la congestin de las mamas. Esto se denomina "lactancia a demanda". Las seales de que el beb tiene hambre incluyen las siguientes:  Aumento del estado de alerta, actividad o inquietud.  Mueve la cabeza de un lado a otro.  Abre la boca cuando se le toca la mejilla o la comisura de la boca (reflejo de bsqueda).  Aumenta las vocalizaciones, tales como sonidos de  succin, se relame los labios, emite arrullos, suspiros o chirridos.  Mueve la mano hacia la boca y se chupa los dedos o las manos.  Est molesto o llora. Evite el uso del chupete en las primeras 4 a 6 semanas despus del nacimiento del beb. Despus de este perodo, podr usar un chupete. Las investigaciones demostraron que el uso del chupete durante el primer ao de vida del beb disminuye el riesgo de tener el sndrome de muerte sbita del lactante (SMSL). Permita que el nio se alimente en cada mama todo lo que desee. Cuando el beb se desprende o se queda dormido mientras se est alimentando de la primera mama, ofrzcale la segunda. Debido a que, con frecuencia, los recin nacidos estn somnolientos las primeras semanas de vida, es posible que deba despertar al beb para alimentarlo. Los horarios de lactancia varan de un beb a otro. Sin embargo, las siguientes reglas pueden servir como gua para ayudarla a garantizar que el beb se alimenta adecuadamente:  Se puede amamantar a los recin nacidos (bebs de 4 semanas o menos de vida) cada 1 a 3 horas.  No deben transcurrir ms de 3 horas durante el da o 5 horas durante la noche sin que se amamante a los recin nacidos.  Debe amamantar al beb un mnimo de 8 veces en un perodo de 24 horas. Extraccin de leche materna     La extraccin y el almacenamiento de la leche materna le permiten asegurarse de que el beb se alimente exclusivamente de su leche materna, aun en momentos en los que no puede amamantar. Esto tiene especial importancia si debe regresar al trabajo en el perodo en que an est amamantando o si no puede estar presente en los momentos en que el beb debe alimentarse. Su asesor en lactancia puede ayudarla a encontrar un mtodo de extraccin que funcione mejor para usted y orientarla sobre cunto tiempo es seguro almacenar leche materna. Cmo cuidar las mamas durante la lactancia Los pezones pueden secarse, agrietarse y doler  durante la lactancia. Las siguientes recomendaciones pueden ayudarla a mantener las mamas humectadas y sanas:  Evite usar jabn en los pezones.  Use un sostn de soporte diseado especialmente para la lactancia materna. Evite usar sostenes con aro o sostenes muy ajustados (sostenes deportivos).  Seque al aire sus pezones durante 3 a 4minutos despus de amamantar al beb.  Utilice solo apsitos de algodn en el sostn para absorber las prdidas de leche. La prdida de un poco de leche materna entre   las tomas es normal.  Utilice lanolina sobre los pezones luego de amamantar. La lanolina ayuda a mantener la humedad normal de la piel. La lanolina pura no es perjudicial (no es txica) para el beb. Adems, puede extraer manualmente algunas gotas de leche materna y masajear suavemente esa leche sobre los pezones para que la leche se seque al aire. Durante las primeras semanas despus del nacimiento, algunas mujeres experimentan congestin mamaria. La congestin mamaria puede hacer que sienta las mamas pesadas, calientes y sensibles al tacto. El pico de la congestin mamaria ocurre en el plazo de los 3 a 5 das despus del parto. Las siguientes recomendaciones pueden ayudarla a aliviar la congestin mamaria:  Vace por completo las mamas al amamantar o extraer leche. Puede aplicar calor hmedo en las mamas (en la ducha o con toallas hmedas para manos) antes de amamantar o extraer leche. Esto aumenta la circulacin y ayuda a que la leche fluya. Si el beb no vaca por completo las mamas cuando lo amamanta, extraiga la leche restante despus de que haya finalizado.  Aplique compresas de hielo sobre las mamas inmediatamente despus de amamantar o extraer leche, a menos que le resulte demasiado incmodo. Haga lo siguiente: ? Ponga el hielo en una bolsa plstica. ? Coloque una toalla entre la piel y la bolsa de hielo. ? Coloque el hielo durante 20minutos, 2 o 3veces por da.  Asegrese de que el beb  est prendido y se encuentre en la posicin correcta mientras lo alimenta. Si la congestin mamaria persiste luego de 48 horas o despus de seguir estas recomendaciones, comunquese con su mdico o un asesor en lactancia. Recomendaciones de salud general durante la lactancia  Consuma 3 comidas y 3 colaciones saludables todos los das. Las madres bien alimentadas que amamantan necesitan entre 450 y 500 caloras adicionales por da. Puede cumplir con este requisito al aumentar la cantidad de una dieta equilibrada que realice.  Beba suficiente agua para mantener la orina clara o de color amarillo plido.  Descanse con frecuencia, reljese y siga tomando sus vitaminas prenatales para prevenir la fatiga, el estrs y los niveles bajos de vitaminas y minerales en el cuerpo (deficiencias de nutrientes).  No consuma ningn producto que contenga nicotina o tabaco, como cigarrillos y cigarrillos electrnicos. El beb puede verse afectado por las sustancias qumicas de los cigarrillos que pasan a la leche materna y por la exposicin al humo ambiental del tabaco. Si necesita ayuda para dejar de fumar, consulte al mdico.  Evite el consumo de alcohol.  No consuma drogas ilegales o marihuana.  Antes de usar cualquier medicamento, hable con el mdico. Estos incluyen medicamentos recetados y de venta libre, como tambin vitaminas y suplementos a base de hierbas. Algunos medicamentos, que pueden ser perjudiciales para el beb, pueden pasar a travs de la leche materna.  Puede quedar embarazada durante la lactancia. Si se desea un mtodo anticonceptivo, consulte al mdico sobre cules son las opciones seguras durante la lactancia. Dnde encontrar ms informacin: Liga internacional La Leche: www.llli.org. Comunquese con un mdico si:  Siente que quiere dejar de amamantar o se siente frustrada con la lactancia.  Sus pezones estn agrietados o sangran.  Sus mamas estn irritadas, sensibles o  calientes.  Tiene los siguientes sntomas: ? Dolor en las mamas o en los pezones. ? Un rea hinchada en cualquiera de las mamas. ? Fiebre o escalofros. ? Nuseas o vmitos. ? Drenaje de otro lquido distinto de la leche materna desde los pezones.  Sus mamas no   se llenan antes de amamantar al beb para el quinto da despus del parto.  Se siente triste y deprimida.  El beb: ? Est demasiado somnoliento como para comer bien. ? Tiene problemas para dormir. ? Tiene ms de 1 semana de vida y moja menos de 6 paales en un periodo de 24 horas. ? No ha aumentado de peso a los 5 das de vida.  El beb defeca menos de 3 veces en 24 horas.  La piel del beb o las partes blancas de los ojos se vuelven amarillentas. Solicite ayuda de inmediato si:  El beb est muy cansado (letargo) y no se quiere despertar para comer.  Le sube la fiebre sin causa. Resumen  La lactancia materna ofrece muchos beneficios de salud para bebs y madres.  Intente amamantar a su beb cuando muestre signos tempranos de hambre.  Haga cosquillas o empuje suavemente los labios del beb con el dedo o el pezn para lograr que el beb abra la boca. Acerque el beb a la mama. Asegrese de que la mayor parte de la arola se encuentre dentro de la boca del beb. Ofrzcale una mama y haga eructar al beb antes de pasar a la otra.  Hable con su mdico o asesor en lactancia si tiene dudas o problemas con la lactancia. Esta informacin no tiene como fin reemplazar el consejo del mdico. Asegrese de hacerle al mdico cualquier pregunta que tenga. Document Revised: 03/06/2018 Document Reviewed: 04/01/2017 Elsevier Patient Education  2020 Elsevier Inc.  

## 2020-03-16 NOTE — Progress Notes (Signed)
OBSTETRICS PRENATAL VIRTUAL VISIT ENCOUNTER NOTE  Provider location: [facility] for [facility] [place]   I connected with Bethany Brewer on 03/16/20 at  1:35 PM EDT by MyChart Video Encounter at home and verified that I am speaking with the correct person using two identifiers.   I discussed the limitations, risks, security and privacy concerns of performing an evaluation and management service virtually and the availability of in person appointments. I also discussed with the patient that there may be a patient responsible charge related to this service. The patient expressed understanding and agreed to proceed. Subjective:  Bethany Brewer is a 34 y.o. [id number] at [redacted]w[redacted]d being seen today for ongoing prenatal care.  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  Patient reports no complaints.  Contractions: Not present. Vag. Bleeding: None.  Movement: Absent. Denies any leaking of fluid.   The following portions of the patient's history were reviewed and updated as appropriate: allergies, current medications, past family history, past medical history, past social history, past surgical history and problem list.   Objective:   Vitals:   03/16/20 1349  BP: 109/71  Pulse: 75    Fetal Status:     Movement: Absent     General:  Alert, oriented and cooperative. Patient is in no acute distress.  Respiratory: Normal respiratory effort, no problems with respiration noted  Mental Status: Normal mood and affect. Normal behavior. Normal judgment and thought content.  Rest of physical exam deferred due to type of encounter  Imaging: US OB Comp Less 14 Wks  Result Date: 02/16/2020 CLINICAL DATA:  Unsure of LMP. EXAM: OBSTETRIC <14 WK ULTRASOUND TECHNIQUE: Transabdominal  ultrasound was performed for evaluation of the gestation as well as the maternal uterus and adnexal regions. COMPARISON:  None. FINDINGS: Intrauterine gestational sac: Single Yolk sac:  Not Visualized. Embryo:  Visualized. Cardiac Activity: Visualized. Heart Rate: 153 bpm CRL:   80 mm   13 w 6 d                  [place] EDC: 08/17/2020 Subchorionic hemorrhage:  None visualized. Maternal uterus/adnexae: Normal appearance of both ovaries. No mass or abnormal free fluid identified. IMPRESSION: Single living IUP with estimated gestational age of [redacted] weeks 6 days, and [place] EDC of 08/17/2020. Electronically Signed   By: Danae Orleans M.D.   On: 02/16/2020 09:41    Assessment and Plan:  Pregnancy: [id number] at [redacted]w[redacted]d 1. Supervision of high risk pregnancy, antepartum Continue routine prenatal care. Needs u/s for anatomy  2. Languagnee barrier Live Spanish interpreter: Eda used   3. Blood in stool Denies hematochezia, melena. Reports blood when wipes and coating her stools. Normal BMs on Colace. - Ambulatory referral to Gastroenterology  General obstetric precautions including but not limited to vaginal bleeding, contractions, leaking of fluid and fetal movement were reviewed in detail with the patient. I discussed the assessment and treatment plan with the patient. The patient was provided an opportunity to ask questions and all were answered. The patient agreed with the plan and demonstrated an understanding of the instructions. The patient was advised to call back or seek an in-person office evaluation/go to MAU at [facility] for any urgent or concerning symptoms. Please refer to After Visit Summary for other counseling recommendations.   I provided 9 minutes of  face-to-face time during this encounter.  Return in 4 weeks (on 04/13/2020) for needs U/S, virtual, referral.  No future appointments.  Donnamae Jude, MD Center for [employer] [facility], [place]

## 2020-03-28 ENCOUNTER — Ambulatory Visit: Payer: Self-pay | Admitting: Physician Assistant

## 2020-04-04 ENCOUNTER — Other Ambulatory Visit: Payer: Self-pay

## 2020-04-04 ENCOUNTER — Ambulatory Visit (HOSPITAL_COMMUNITY): Payer: Self-pay | Admitting: *Deleted

## 2020-04-04 ENCOUNTER — Ambulatory Visit (HOSPITAL_COMMUNITY)
Admission: RE | Admit: 2020-04-04 | Discharge: 2020-04-04 | Disposition: A | Discharge status: Home / Self Care | Payer: Self-pay | Source: Ambulatory Visit | Attending: Obstetrics & Gynecology | Admitting: Obstetrics & Gynecology

## 2020-04-04 ENCOUNTER — Encounter (HOSPITAL_COMMUNITY): Payer: Self-pay | Admitting: *Deleted

## 2020-04-04 DIAGNOSIS — Z363 Encounter for antenatal screening for malformations: Secondary | ICD-10-CM

## 2020-04-04 DIAGNOSIS — O99212 Obesity complicating pregnancy, second trimester: Secondary | ICD-10-CM

## 2020-04-04 DIAGNOSIS — Z3A20 20 weeks gestation of pregnancy: Secondary | ICD-10-CM

## 2020-04-04 DIAGNOSIS — O0990 Supervision of high risk pregnancy, unspecified, unspecified trimester: Secondary | ICD-10-CM

## 2020-04-04 DIAGNOSIS — O09292 Supervision of pregnancy with other poor reproductive or obstetric history, second trimester: Secondary | ICD-10-CM

## 2020-04-04 DIAGNOSIS — O09299 Supervision of pregnancy with other poor reproductive or obstetric history, unspecified trimester: Secondary | ICD-10-CM | POA: Insufficient documentation

## 2020-04-04 DIAGNOSIS — Z789 Other specified health status: Secondary | ICD-10-CM

## 2020-04-05 ENCOUNTER — Other Ambulatory Visit (HOSPITAL_COMMUNITY): Payer: Self-pay | Admitting: *Deleted

## 2020-04-05 DIAGNOSIS — Z362 Encounter for other antenatal screening follow-up: Secondary | ICD-10-CM

## 2020-04-13 ENCOUNTER — Ambulatory Visit (INDEPENDENT_AMBULATORY_CARE_PROVIDER_SITE_OTHER): Payer: Self-pay | Admitting: Family Medicine

## 2020-04-13 ENCOUNTER — Other Ambulatory Visit: Payer: Self-pay

## 2020-04-13 ENCOUNTER — Encounter: Payer: Self-pay | Admitting: Family Medicine

## 2020-04-13 DIAGNOSIS — Z603 Acculturation difficulty: Secondary | ICD-10-CM

## 2020-04-13 DIAGNOSIS — Z789 Other specified health status: Secondary | ICD-10-CM

## 2020-04-13 DIAGNOSIS — Z3A22 22 weeks gestation of pregnancy: Secondary | ICD-10-CM

## 2020-04-13 DIAGNOSIS — O0992 Supervision of high risk pregnancy, unspecified, second trimester: Secondary | ICD-10-CM

## 2020-04-13 DIAGNOSIS — O09299 Supervision of pregnancy with other poor reproductive or obstetric history, unspecified trimester: Secondary | ICD-10-CM

## 2020-04-13 DIAGNOSIS — O0990 Supervision of high risk pregnancy, unspecified, unspecified trimester: Secondary | ICD-10-CM

## 2020-04-13 DIAGNOSIS — O09293 Supervision of pregnancy with other poor reproductive or obstetric history, third trimester: Secondary | ICD-10-CM

## 2020-04-13 NOTE — Patient Instructions (Signed)
Segundo trimestre de embarazo Second Trimester of Pregnancy  El segundo trimestre va desde la semana14 hasta la 27 (desde el mes 4 hasta el 6). Este suele ser el momento en el que mejor se siente. En general, las nuseas matutinas han disminuido o han desaparecido completamente. Tendr ms energa y podr aumentarle el apetito. El beb en gestacin se desarrolla rpidamente. Hacia el final del sexto mes, el beb mide aproximadamente 9 pulgadas (23 cm) y pesa alrededor de 1 libras (700 g). Es probable que sienta al beb moverse entre las 18 y 20 semanas del embarazo. Siga estas indicaciones en su casa: Medicamentos  Tome los medicamentos de venta libre y los recetados solamente como se lo haya indicado el mdico. Algunos medicamentos son seguros para tomar durante el embarazo y otros no lo son.  Tome vitaminas prenatales que contengan por lo menos 600microgramos (?g) de cido flico.  Si tiene dificultad para mover el intestino (estreimiento), tome un medicamento para ablandar las heces (laxante) si su mdico se lo autoriza. Comida y bebida   Ingiera alimentos saludables de manera regular.  No coma carne cruda ni quesos sin cocinar.  Si obtiene poca cantidad de calcio de los alimentos que ingiere, consulte a su mdico sobre la posibilidad de tomar un suplemento diario de calcio.  Evite el consumo de alimentos ricos en grasas y azcares, como los alimentos fritos y los dulces.  Si tiene malestar estomacal (nuseas) o devuelve (vomita): ? Ingiera 4 o 5comidas pequeas por da en lugar de 3abundantes. ? Intente comer algunas galletitas saladas. ? Beba lquidos entre las comidas, en lugar de hacerlo durante estas.  Para evitar el estreimiento: ? Consuma alimentos ricos en fibra, como frutas y verduras frescas, cereales integrales y frijoles. ? Beba suficiente lquido para mantener el pis (orina) claro o de color amarillo plido. Actividad  Haga ejercicios solamente como se lo haya  indicado el mdico. Interrumpa la actividad fsica si comienza a tener calambres.  No haga ejercicio si hace demasiado calor, hay demasiada humedad o se encuentra en un lugar de mucha altura (altitud alta).  Evite levantar pesos excesivos.  Use zapatos con tacones bajos. Mantenga una buena postura al sentarse y pararse.  Puede continuar teniendo relaciones sexuales, a menos que el mdico le indique lo contrario. Alivio del dolor y del malestar  Use un sostn que le brinde buen soporte si sus mamas estn sensibles.  Dese baos de asiento con agua tibia para aliviar el dolor o las molestias causadas por las hemorroides. Use una crema para las hemorroides si el mdico la autoriza.  Descanse con las piernas elevadas si tiene calambres o dolor de cintura.  Si desarrolla venas hinchadas y abultadas (vrices) en las piernas: ? Use medias de compresin o medias de descanso como se lo haya indicado el mdico. ? Levante (eleve) los pies durante 15minutos, 3 o 4veces por da. ? Limite el consumo de sal en sus alimentos. Cuidado prenatal  Escriba sus preguntas. Llvelas cuando concurra a las visitas prenatales.  Concurra a todas las visitas prenatales como se lo haya indicado el mdico. Esto es importante. Seguridad  Colquese el cinturn de seguridad cuando conduzca.  Haga una lista de los nmeros de telfono de emergencia, que incluya los nmeros de telfono de familiares, amigos, el hospital, as como los departamentos de polica y bomberos. Instrucciones generales  Consulte a su mdico sobre los alimentos que debe comer o pdale que la ayude a encontrar a quien pueda aconsejarla si necesita ese servicio.    Consulte a su mdico acerca de dnde se dictan clases prenatales cerca de donde vive. Comience las clases antes del mes 6 de embarazo.  No se d baos de inmersin en agua caliente, baos turcos ni saunas.  No se haga duchas vaginales ni use tampones o toallas higinicas perfumadas.   No mantenga las piernas cruzadas durante mucho tiempo.  Vaya al dentista si an no lo hizo. Use un cepillo de cerdas suaves para cepillarse los dientes. Psese el hilo dental suavemente.  No fume, no consuma hierbas ni beba alcohol. No tome frmacos que el mdico no haya autorizado.  No consuma ningn producto que contenga nicotina o tabaco, como cigarrillos y cigarrillos electrnicos. Si necesita ayuda para dejar de fumar, consulte al mdico.  Evite el contacto con las bandejas sanitarias de los gatos y la tierra que estos animales usan. Estos elementos contienen bacterias que pueden causar defectos congnitos al beb y la posible prdida del beb (aborto espontneo) o la muerte fetal. Comunquese con un mdico si:  Tiene clicos leves o siente presin en la parte baja del vientre.  Tiene dolor al hacer pis (orinar).  Advierte un lquido con olor ftido que proviene de la vagina.  Tiene malestar estomacal (nuseas), devuelve (vomita) o tiene deposiciones acuosas (diarrea).  Sufre un dolor persistente en el abdomen.  Siente mareos. Solicite ayuda de inmediato si:  Tiene fiebre.  Tiene una prdida de lquido por la vagina.  Tiene sangrado o pequeas prdidas vaginales.  Siente dolor intenso o clicos en el abdomen.  Sube o baja de peso rpidamente.  Tiene dificultades para recuperar el aliento y siente dolor en el pecho.  Sbitamente se le hinchan mucho el rostro, las manos, los tobillos, los pies o las piernas.  No ha sentido los movimientos del beb durante una hora.  Siente un dolor de cabeza intenso que no se alivia al tomar medicamentos.  Tiene dificultad para ver. Resumen  El segundo trimestre va desde la semana14 hasta la 27, desde el mes 4 hasta el 6. Este suele ser el momento en el que mejor se siente.  Para cuidarse y cuidar a su beb en gestacin, debe comer alimentos saludables, tomar medicamentos solamente si su mdico le indica que lo haga y hacer  actividades que sean seguras para usted y su beb.  Llame al mdico si se enferma o si nota algo inusual acerca de su embarazo. Tambin llame al mdico si necesita ayuda para saber qu alimentos debe comer o si quiere saber qu actividades puede realizar de forma segura. Esta informacin no tiene como fin reemplazar el consejo del mdico. Asegrese de hacerle al mdico cualquier pregunta que tenga. Document Revised: 09/04/2017 Document Reviewed: 09/04/2017 Elsevier Patient Education  2020 [employer]vier Inc[employer]  

## 2020-04-13 NOTE — Progress Notes (Signed)
I connected with  Fara Chute on 04/13/20 at  1:15 PM EDT by telephone and verified that I am speaking with the correct person using two identifiers.   I discussed the limitations, risks, security and privacy concerns of performing an evaluation and management service by telephone and the availability of in person appointments. I also discussed with the patient that there may be a patient responsible charge related to this service. The patient expressed understanding and agreed to proceed.  Henrietta Dine, CMA 04/13/2020  1:16 PM

## 2020-04-13 NOTE — Progress Notes (Signed)
Patient states is concerned of lead in Prenatal vitamins that she was taking.

## 2020-04-13 NOTE — Progress Notes (Signed)
   TELEHEALTH VIRTUAL OBSTETRICS VISIT ENCOUNTER NOTE  I connected with Fara Chute on 04/13/20 at  1:15 PM EDT by telephone at home and verified that I am speaking with the correct person using two identifiers.   I discussed the limitations, risks, security and privacy concerns of performing an evaluation and management service by telephone and the availability of in person appointments. I also discussed with the patient that there may be a patient responsible charge related to this service. The patient expressed understanding and agreed to proceed.  Subjective:  Bethany Brewer is a 34 y.o. [id number] at [redacted]w[redacted]d being followed for ongoing prenatal care.  She is currently monitored for the following issues for this high-risk pregnancy and has Previous child with multiple anomalies, antepartum, unspecified trimester; Supervision of high risk pregnancy, antepartum; and Language barrier on their problem list.  Patient reports no complaints. Reports fetal movement. Denies any contractions, bleeding or leaking of fluid.   The following portions of the patient's history were reviewed and updated as appropriate: allergies, current medications, past family history, past medical history, past social history, past surgical history and problem list.   Objective:   General:  Alert, oriented and cooperative.   Mental Status: Normal mood and affect perceived. Normal judgment and thought content.  Rest of physical exam deferred due to type of encounter  Assessment and Plan:  Pregnancy: [id number] at [redacted]w[redacted]d 1. Supervision of high risk pregnancy, antepartum - Continue routine prenatal care - Anatomy incomplete, has follow up with MFM in May, not yet scheduled  2. Language barrier - Spanish interpreter used for this encounter  3. Previous child with multiple anomalies, antepartum, unspecified trimester - Anatomy scan incomplete, has follow up with MFM in May, will send massage for scheduling as no  appointment yet  Preterm labor symptoms and general obstetric precautions including but not limited to vaginal bleeding, contractions, leaking of fluid and fetal movement were reviewed in detail with the patient.  I discussed the assessment and treatment plan with the patient. The patient was provided an opportunity to ask questions and all were answered. The patient agreed with the plan and demonstrated an understanding of the instructions. The patient was advised to call back or seek an in-person office evaluation/go to MAU at [facility] for any urgent or concerning symptoms. Please refer to After Visit Summary for other counseling recommendations.   I provided 9 minutes of non-face-to-face time during this encounter.  No follow-ups on file.  Future Appointments  Date Time Provider Department Center  05/06/2020  3:45 PM [facility] NURSE [facility] M[facility]-US  05/06/2020  3:45 PM [facility] [place] 4 WH-MFCUS MFC-US    Marlowe Alt, DO Center for [employer], [facility]

## 2020-04-18 ENCOUNTER — Encounter: Payer: Self-pay | Admitting: *Deleted

## 2020-05-06 ENCOUNTER — Encounter: Payer: Self-pay | Admitting: *Deleted

## 2020-05-06 ENCOUNTER — Ambulatory Visit (HOSPITAL_COMMUNITY): Payer: Self-pay | Attending: Obstetrics and Gynecology

## 2020-05-06 ENCOUNTER — Other Ambulatory Visit: Payer: Self-pay

## 2020-05-06 ENCOUNTER — Ambulatory Visit: Payer: Self-pay | Admitting: *Deleted

## 2020-05-06 DIAGNOSIS — Z789 Other specified health status: Secondary | ICD-10-CM

## 2020-05-06 DIAGNOSIS — O09292 Supervision of pregnancy with other poor reproductive or obstetric history, second trimester: Secondary | ICD-10-CM

## 2020-05-06 DIAGNOSIS — Z3A25 25 weeks gestation of pregnancy: Secondary | ICD-10-CM

## 2020-05-06 DIAGNOSIS — E669 Obesity, unspecified: Secondary | ICD-10-CM

## 2020-05-06 DIAGNOSIS — O99212 Obesity complicating pregnancy, second trimester: Secondary | ICD-10-CM

## 2020-05-06 DIAGNOSIS — O0990 Supervision of high risk pregnancy, unspecified, unspecified trimester: Secondary | ICD-10-CM | POA: Insufficient documentation

## 2020-05-06 DIAGNOSIS — Z362 Encounter for other antenatal screening follow-up: Secondary | ICD-10-CM | POA: Insufficient documentation

## 2020-05-10 ENCOUNTER — Other Ambulatory Visit: Payer: Self-pay | Admitting: *Deleted

## 2020-05-10 DIAGNOSIS — O09299 Supervision of pregnancy with other poor reproductive or obstetric history, unspecified trimester: Secondary | ICD-10-CM

## 2020-05-11 ENCOUNTER — Ambulatory Visit (INDEPENDENT_AMBULATORY_CARE_PROVIDER_SITE_OTHER): Payer: Self-pay | Admitting: Obstetrics & Gynecology

## 2020-05-11 ENCOUNTER — Other Ambulatory Visit: Payer: Self-pay

## 2020-05-11 VITALS — BP 111/69 | HR 67

## 2020-05-11 DIAGNOSIS — O09292 Supervision of pregnancy with other poor reproductive or obstetric history, second trimester: Secondary | ICD-10-CM

## 2020-05-11 DIAGNOSIS — O09299 Supervision of pregnancy with other poor reproductive or obstetric history, unspecified trimester: Secondary | ICD-10-CM

## 2020-05-11 DIAGNOSIS — O0990 Supervision of high risk pregnancy, unspecified, unspecified trimester: Secondary | ICD-10-CM

## 2020-05-11 DIAGNOSIS — Z3A26 26 weeks gestation of pregnancy: Secondary | ICD-10-CM

## 2020-05-11 DIAGNOSIS — Z789 Other specified health status: Secondary | ICD-10-CM

## 2020-05-11 NOTE — Patient Instructions (Signed)
Tercer trimestre de embarazo Third Trimester of Pregnancy  El tercer trimestre comprende desde la semana28 hasta la semana40 (desde el mes7 hasta el mes9). En este trimestre, el beb en gestacin (feto) crece muy rpidamente. Hacia el final del noveno mes, el beb en gestacin mide alrededor de 20pulgadas (45cm) de largo. Pesa entre 6y 10libras (2,70y 4,50kg). Siga estas indicaciones en su casa: Medicamentos  Tome los medicamentos de venta libre y los recetados solamente como se lo haya indicado el mdico. Algunos medicamentos son seguros para tomar durante el embarazo y otros no lo son.  Tome vitaminas prenatales que contengan por lo menos 600microgramos (?g) de cido flico.  Si tiene dificultad para mover el intestino (estreimiento), tome un medicamento para ablandar las heces (laxante) si su mdico se lo autoriza. Comida y bebida   Ingiera alimentos saludables de manera regular.  No coma carne cruda ni quesos sin cocinar.  Si obtiene poca cantidad de calcio de los alimentos que ingiere, consulte a su mdico sobre la posibilidad de tomar un suplemento diario de calcio.  La ingesta diaria de cuatro o cinco comidas pequeas en lugar de tres comidas abundantes.  Evite el consumo de alimentos ricos en grasas y azcares, como los alimentos fritos y los dulces.  Para evitar el estreimiento: ? Consuma alimentos ricos en fibra, como frutas y verduras frescas, cereales integrales y frijoles. ? Beba suficiente lquido para mantener el pis (orina) claro o de color amarillo plido. Actividad  Haga ejercicios solamente como se lo haya indicado el mdico. Interrumpa la actividad fsica si comienza a tener calambres.  No levante objetos pesados, use zapatos de tacones bajos y sintese derecha.  No haga ejercicio si hace demasiado calor, hay demasiada humedad o se encuentra en un lugar de mucha altura (altitud alta).  Puede continuar teniendo relaciones sexuales, a menos que el  mdico le indique lo contrario. Alivio del dolor y del malestar  Use un sostn que le brinde buen soporte si sus mamas estn sensibles.  Haga pausas frecuentes y descanse con las piernas levantadas si tiene calambres en las piernas o dolor en la zona lumbar.  Dese baos de asiento con agua tibia para aliviar el dolor o las molestias causadas por las hemorroides. Use una crema para las hemorroides si el mdico la autoriza.  Si desarrolla venas hinchadas y abultadas (vrices) en las piernas: ? Use medias de compresin o medias de descanso como se lo haya indicado el mdico. ? Levante (eleve) los pies durante 15minutos, 3 o 4veces por da. ? Limite el consumo de sal en sus alimentos. Seguridad  Colquese el cinturn de seguridad cuando conduzca.  Haga una lista de los nmeros de telfono de emergencia, que incluya los nmeros de telfono de familiares, amigos, el hospital, as como los departamentos de polica y bomberos. Preparacin para la llegada del beb Para prepararse para la llegada de su beb:  Tome clases prenatales.  Practique ir manejando al hospital.  Visite el hospital y recorra el rea de maternidad.  Hable en su trabajo acerca de tomar licencia cuando llegue el beb.  Prepare el bolso que llevar al hospital.  Prepare la habitacin del beb.  Concurra a los controles mdicos.  Compre un asiento de seguridad orientado hacia atrs para llevar al beb en el automvil. Aprenda cmo instalarlo en el auto. Instrucciones generales  No se d baos de inmersin en agua caliente, baos turcos ni saunas.  No consuma ningn producto que contenga nicotina o tabaco, como cigarrillos y cigarrillos   electrnicos. Si necesita ayuda para dejar de fumar, consulte al mdico.  No beba alcohol.  No se haga duchas vaginales ni use tampones o toallas higinicas perfumadas.  No mantenga las piernas cruzadas durante mucho tiempo.  No haga viajes de larga distancia, excepto si es  obligatorio. Hgalos solamente si su mdico la autoriza.  Visite a su dentista si no lo ha hecho durante el embarazo. Use un cepillo de cerdas suaves para cepillarse los dientes. Psese el hilo dental con suavidad.  Evite el contacto con las bandejas sanitarias de los gatos y la tierra que estos animales usan. Estos elementos contienen bacterias que pueden causar defectos congnitos al beb y la posible prdida del beb (aborto espontneo) o la muerte fetal.  Concurra a todas las visitas prenatales como se lo haya indicado el mdico. Esto es importante. Comunquese con un mdico si:  No est segura de si est en trabajo de parto o si ha roto la bolsa de las aguas.  Tiene mareos.  Tiene clicos leves o siente presin en la parte baja del vientre.  Sufre un dolor persistente en el abdomen.  Sigue teniendo malestar estomacal, vomita o tiene heces lquidas.  Advierte un lquido con olor ftido que proviene de la vagina.  Siente dolor al orinar. Solicite ayuda de inmediato si:  Tiene fiebre.  Tiene una prdida de lquido por la vagina.  Tiene sangrado o pequeas prdidas vaginales.  Siente dolor intenso o clicos en el abdomen.  Aumenta o baja de peso rpidamente.  Tiene dificultades para recuperar el aliento y siente dolor en el pecho.  Sbitamente se le hinchan mucho el rostro, las manos, los tobillos, los pies o las piernas.  No ha sentido los movimientos del beb durante una hora.  Siente un dolor de cabeza intenso que no se alivia con medicamentos.  Tiene dificultad para ver.  Tiene prdida de lquido o le sale un chorro de lquido de la vagina antes de estar en la semana 37.  Tiene espasmos abdominales (contracciones) regulares antes de estar en la semana 37. Resumen  El tercer trimestre comprende desde la semana28 hasta la semana40 (desde el mes7 hasta el mes9). Esta es la poca en que el beb en gestacin crece muy rpidamente.  Siga los consejos del mdico  con respecto a los medicamentos, la alimentacin y la actividad.  Preprese para la llegada del beb tomando las clases prenatales, preparando todo lo que necesitar el beb, arreglando la habitacin del beb y concurriendo a los controles mdicos.  Solicite ayuda de inmediato si tiene sangrado por la vagina, siente dolor en el pecho o tiene dificultad para respirar, o si no ha sentido que su beb se mueve en el transcurso de ms de una hora. Esta informacin no tiene como fin reemplazar el consejo del mdico. Asegrese de hacerle al mdico cualquier pregunta que tenga. Document Revised: 07/15/2017 Document Reviewed: 07/15/2017 Elsevier Patient Education  2020 Elsevier Inc.  

## 2020-05-11 NOTE — Progress Notes (Signed)
[id number] luis spanish interperter   I connected with  Fara Chute on 05/11/20 at  1:15 PM EDT by telephone and verified that I am speaking with the correct person using two identifiers.   I discussed the limitations, risks, security and privacy concerns of performing an evaluation and management service by telephone and the availability of in person appointments. I also discussed with the patient that there may be a patient responsible charge related to this service. The patient expressed understanding and agreed to proceed.  Janene Madeira Capel, CMA 05/11/2020  1:15 PM  Future GTT Orders placed

## 2020-05-11 NOTE — Progress Notes (Signed)
   TELEHEALTH OBSTETRICS VISIT ENCOUNTER NOTE  I connected with Bethany Brewer on 05/11/20 at  1:15 PM EDT by telephone at home and verified that I am speaking with the correct person using two identifiers.   I discussed the limitations, risks, security and privacy concerns of performing an evaluation and management service by telephone and the availability of in person appointments. I also discussed with the patient that there may be a patient responsible charge related to this service. The patient expressed understanding and agreed to proceed.  Subjective:  Bethany Brewer is a 34 y.o. [id number] at [redacted]w[redacted]d being followed for ongoing prenatal care.  She is currently monitored for the following issues for this high-risk pregnancy and has Previous child with multiple anomalies, antepartum, unspecified trimester; Supervision of high risk pregnancy, antepartum; and Language barrier on their problem list.  Patient reports feet hurt after walking, also pedal edema. Reports fetal movement. Denies any contractions, bleeding or leaking of fluid.   The following portions of the patient's history were reviewed and updated as appropriate: allergies, current medications, past family history, past medical history, past social history, past surgical history and problem list.   Objective:   General:  Alert, oriented and cooperative.   Mental Status: Normal mood and affect perceived. Normal judgment and thought content.  Rest of physical exam deferred due to type of encounter  Assessment and Plan:  Pregnancy: [id number] at [redacted]w[redacted]d 1. Previous child with multiple anomalies, antepartum, unspecified trimester Normal [place]  2. Supervision of high risk pregnancy, antepartum F/u US is scheduled  3. Language barrier Interpreter utilized for the visit  Preterm labor symptoms and general obstetric precautions including but not limited to vaginal bleeding, contractions, leaking of fluid and fetal movement were  reviewed in detail with the patient.  I discussed the assessment and treatment plan with the patient. The patient was provided an opportunity to ask questions and all were answered. The patient agreed with the plan and demonstrated an understanding of the instructions. The patient was advised to call back or seek an in-person office evaluation/go to [facility] at [facility] for any urgent or concerning symptoms. Please refer to After Visit Summary for other counseling recommendations.   I provided 11 minutes of non-face-to-face time during this encounter.  Return in about 2 weeks (around 05/25/2020) for 2 hr.  Future Appointments  Date Time Provider Department Center  07/01/2020  2:30 PM [facility]    Scheryl Darter, MD [facility]

## 2020-05-30 ENCOUNTER — Other Ambulatory Visit: Payer: Self-pay

## 2020-05-30 ENCOUNTER — Ambulatory Visit (INDEPENDENT_AMBULATORY_CARE_PROVIDER_SITE_OTHER): Payer: Self-pay | Admitting: Obstetrics & Gynecology

## 2020-05-30 DIAGNOSIS — Z23 Encounter for immunization: Secondary | ICD-10-CM

## 2020-05-30 DIAGNOSIS — O0993 Supervision of high risk pregnancy, unspecified, third trimester: Secondary | ICD-10-CM

## 2020-05-30 DIAGNOSIS — O0990 Supervision of high risk pregnancy, unspecified, unspecified trimester: Secondary | ICD-10-CM

## 2020-05-30 DIAGNOSIS — Z3A28 28 weeks gestation of pregnancy: Secondary | ICD-10-CM

## 2020-05-30 NOTE — Progress Notes (Signed)
   PRENATAL VISIT NOTE  Subjective:  Bethany Brewer is a 34 y.o. [id number] at [redacted]w[redacted]d being seen today for ongoing prenatal care.  She is currently monitored for the following issues for this high-risk pregnancy and has Previous child with multiple anomalies, antepartum, unspecified trimester; Supervision of high risk pregnancy, antepartum; and Language barrier on their problem list.  Patient reports no complaints.  Contractions: Not present. Vag. Bleeding: None.  Movement: Present. Denies leaking of fluid.   The following portions of the patient's history were reviewed and updated as appropriate: allergies, current medications, past family history, past medical history, past social history, past surgical history and problem list.   Objective:   Vitals:   05/30/20 0904  BP: 104/66  Pulse: 72    Fetal Status: Fetal Heart Rate (bpm): 146   Movement: Present     General:  Alert, oriented and cooperative. Patient is in no acute distress.  Skin: Skin is warm and dry. No rash noted.   Cardiovascular: Normal heart rate noted  Respiratory: Normal respiratory effort, no problems with respiration noted  Abdomen: Soft, gravid, appropriate for gestational age.  Pain/Pressure: Absent     Pelvic: Cervical exam deferred        Extremities: Normal range of motion.  Edema: None  Mental Status: Normal mood and affect. Normal behavior. Normal judgment and thought content.   Assessment and Plan:  Pregnancy: [id number] at [redacted]w[redacted]d 1. Supervision of high risk pregnancy, antepartum 2hr GTT today.  - Tdap vaccine greater than or equal to 7yo IM  Preterm labor symptoms and general obstetric precautions including but not limited to vaginal bleeding, contractions, leaking of fluid and fetal movement were reviewed in detail with the patient. Please refer to After Visit Summary for other counseling recommendations.   No follow-ups on file.  Future Appointments  Date Time Provider Department Center  07/01/2020   2:30 PM [facility]3 [facility]    Malachy Chamber, MD

## 2020-05-30 NOTE — Patient Instructions (Signed)
Glucose Tolerance Test During Pregnancy Why am I having this test? The glucose tolerance test (GTT) is done to check how your body processes sugar (glucose). This is one of several tests used to diagnose diabetes that develops during pregnancy (gestational diabetes mellitus). Gestational diabetes is a temporary form of diabetes that some women develop during pregnancy. It usually occurs during the second trimester of pregnancy and goes away after delivery. Testing (screening) for gestational diabetes usually occurs between 24 and 28 weeks of pregnancy. You may have the GTT test after having a 1-hour glucose screening test if the results from that test indicate that you may have gestational diabetes. You may also have this test if:  You have a history of gestational diabetes.  You have a history of giving birth to very large babies or have experienced repeated fetal loss (stillbirth).  You have signs and symptoms of diabetes, such as: ? Changes in your vision. ? Tingling or numbness in your hands or feet. ? Changes in hunger, thirst, and urination that are not otherwise explained by your pregnancy. What is being tested? This test measures the amount of glucose in your blood at different times during a period of 3 hours. This indicates how well your body is able to process glucose. What kind of sample is taken?  Blood samples are required for this test. They are usually collected by inserting a needle into a blood vessel. How do I prepare for this test?  For 3 days before your test, eat normally. Have plenty of carbohydrate-rich foods.  Follow instructions from your health care provider about: ? Eating or drinking restrictions on the day of the test. You may be asked to not eat or drink anything other than water (fast) starting 8-10 hours before the test. ? Changing or stopping your regular medicines. Some medicines may interfere with this test. Tell a health care provider about:  All  medicines you are taking, including vitamins, herbs, eye drops, creams, and over-the-counter medicines.  Any blood disorders you have.  Any surgeries you have had.  Any medical conditions you have. What happens during the test? First, your blood glucose will be measured. This is referred to as your fasting blood glucose, since you fasted before the test. Then, you will drink a glucose solution that contains a certain amount of glucose. Your blood glucose will be measured again 1, 2, and 3 hours after drinking the solution. This test takes about 3 hours to complete. You will need to stay at the testing location during this time. During the testing period:  Do not eat or drink anything other than the glucose solution.  Do not exercise.  Do not use any products that contain nicotine or tobacco, such as cigarettes and e-cigarettes. If you need help stopping, ask your health care provider. The testing procedure may vary among health care providers and hospitals. How are the results reported? Your results will be reported as milligrams of glucose per deciliter of blood (mg/dL) or millimoles per liter (mmol/L). Your health care provider will compare your results to normal ranges that were established after testing a large group of people (reference ranges). Reference ranges may vary among labs and hospitals. For this test, common reference ranges are:  Fasting: less than 95-105 mg/dL (5.3-5.8 mmol/L).  1 hour after drinking glucose: less than 180-190 mg/dL (10.0-10.5 mmol/L).  2 hours after drinking glucose: less than 155-165 mg/dL (8.6-9.2 mmol/L).  3 hours after drinking glucose: 140-145 mg/dL (7.8-8.1 mmol/L). What do the   results mean? Results within reference ranges are considered normal, meaning that your glucose levels are well-controlled. If two or more of your blood glucose levels are high, you may be diagnosed with gestational diabetes. If only one level is high, your health care  provider may suggest repeat testing or other tests to confirm a diagnosis. Talk with your health care provider about what your results mean. Questions to ask your health care provider Ask your health care provider, or the department that is doing the test:  When will my results be ready?  How will I get my results?  What are my treatment options?  What other tests do I need?  What are my next steps? Summary  The glucose tolerance test (GTT) is one of several tests used to diagnose diabetes that develops during pregnancy (gestational diabetes mellitus). Gestational diabetes is a temporary form of diabetes that some women develop during pregnancy.  You may have the GTT test after having a 1-hour glucose screening test if the results from that test indicate that you may have gestational diabetes. You may also have this test if you have any symptoms or risk factors for gestational diabetes.  Talk with your health care provider about what your results mean. This information is not intended to replace advice given to you by your health care provider. Make sure you discuss any questions you have with your health care provider. Document Revised: 04/02/2019 Document Reviewed: 07/22/2017 Elsevier Patient Education  2020 Elsevier Inc.  

## 2020-05-31 LAB — CBC
Hematocrit: 32.7 % — ABNORMAL LOW (ref 34.0–46.6)
Hemoglobin: 11.1 g/dL (ref 11.1–15.9)
MCH: 30.7 pg (ref 26.6–33.0)
MCHC: 33.9 g/dL (ref 31.5–35.7)
MCV: 90 fL (ref 79–97)
Platelets: 209 10*3/uL (ref 150–450)
RBC: 3.62 x10E6/uL — ABNORMAL LOW (ref 3.77–5.28)
RDW: 13.1 % (ref 11.7–15.4)
WBC: 10.3 10*3/uL (ref 3.4–10.8)

## 2020-05-31 LAB — GLUCOSE TOLERANCE, 2 HOURS W/ 1HR
Glucose, 1 hour: 119 mg/dL (ref 65–179)
Glucose, 2 hour: 112 mg/dL (ref 65–152)
Glucose, Fasting: 78 mg/dL (ref 65–91)

## 2020-05-31 LAB — RPR: RPR Ser Ql: NONREACTIVE

## 2020-05-31 LAB — HIV ANTIBODY (ROUTINE TESTING W REFLEX): HIV Screen 4th Generation wRfx: NONREACTIVE

## 2020-05-31 LAB — ANTIBODY SCREEN: Antibody Screen: NEGATIVE

## 2020-06-28 ENCOUNTER — Encounter: Payer: Self-pay | Admitting: Obstetrics and Gynecology

## 2020-07-01 ENCOUNTER — Ambulatory Visit: Payer: Self-pay | Attending: Obstetrics and Gynecology

## 2020-07-01 ENCOUNTER — Other Ambulatory Visit: Payer: Self-pay

## 2020-07-01 DIAGNOSIS — Z3A33 33 weeks gestation of pregnancy: Secondary | ICD-10-CM

## 2020-07-01 DIAGNOSIS — Z362 Encounter for other antenatal screening follow-up: Secondary | ICD-10-CM

## 2020-07-01 DIAGNOSIS — E669 Obesity, unspecified: Secondary | ICD-10-CM

## 2020-07-01 DIAGNOSIS — O09293 Supervision of pregnancy with other poor reproductive or obstetric history, third trimester: Secondary | ICD-10-CM

## 2020-07-01 DIAGNOSIS — O99213 Obesity complicating pregnancy, third trimester: Secondary | ICD-10-CM

## 2020-07-01 DIAGNOSIS — O09299 Supervision of pregnancy with other poor reproductive or obstetric history, unspecified trimester: Secondary | ICD-10-CM | POA: Insufficient documentation

## 2020-07-13 ENCOUNTER — Ambulatory Visit (INDEPENDENT_AMBULATORY_CARE_PROVIDER_SITE_OTHER): Payer: Self-pay | Admitting: Obstetrics and Gynecology

## 2020-07-13 ENCOUNTER — Other Ambulatory Visit: Payer: Self-pay

## 2020-07-13 VITALS — BP 117/75 | HR 82 | Wt 189.6 lb

## 2020-07-13 DIAGNOSIS — Z3A35 35 weeks gestation of pregnancy: Secondary | ICD-10-CM

## 2020-07-13 DIAGNOSIS — Z8759 Personal history of other complications of pregnancy, childbirth and the puerperium: Secondary | ICD-10-CM

## 2020-07-13 DIAGNOSIS — O0993 Supervision of high risk pregnancy, unspecified, third trimester: Secondary | ICD-10-CM

## 2020-07-13 DIAGNOSIS — O09299 Supervision of pregnancy with other poor reproductive or obstetric history, unspecified trimester: Secondary | ICD-10-CM

## 2020-07-13 DIAGNOSIS — Z603 Acculturation difficulty: Secondary | ICD-10-CM

## 2020-07-13 DIAGNOSIS — O0990 Supervision of high risk pregnancy, unspecified, unspecified trimester: Secondary | ICD-10-CM

## 2020-07-13 DIAGNOSIS — Z789 Other specified health status: Secondary | ICD-10-CM

## 2020-07-13 NOTE — Progress Notes (Signed)
Prenatal Visit Note Date: 07/13/2020 Clinic: [facility]  Subjective:  Bethany Brewer is a 34 y.o. [id number] at [redacted]w[redacted]d being seen today for ongoing prenatal care.  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  Patient reports no complaints.   Contractions: Irregular. Vag. Bleeding: None.  Movement: Present. Denies leaking of fluid.   The following portions of the patient's history were reviewed and updated as appropriate: allergies, current medications, past family history, past medical history, past social history, past surgical history and problem list. Problem list updated.  Objective:   Vitals:   07/13/20 1047  BP: 117/75  Pulse: 82  Weight: 189 lb 9.6 oz (86 kg)    Fetal Status: Fetal Heart Rate (bpm): 142 Fundal Height: 35 cm Movement: Present  Presentation: Vertex  General:  Alert, oriented and cooperative. Patient is in no acute distress.  Skin: Skin is warm and dry. No rash noted.   Cardiovascular: Normal heart rate noted  Respiratory: Normal respiratory effort, no problems with respiration noted  Abdomen: Soft, gravid, appropriate for gestational age. Pain/Pressure: Present     Pelvic:  Cervical exam deferred        Extremities: Normal range of motion.  Edema: None  Mental Status: Normal mood and affect. Normal behavior. Normal judgment and thought content.   Urinalysis:      Assessment and Plan:  Pregnancy: [id number] at [redacted]w[redacted]d  1. Supervision of high risk pregnancy, antepartum GBS nv  2. Language barrier intepreter used  3. Previous child with multiple anomalies, antepartum, unspecified trimester Last growth u/s normal. Rpt PRN  Preterm labor symptoms and general obstetric precautions including but not limited to vaginal bleeding,  contractions, leaking of fluid and fetal movement were reviewed in detail with the patient. Please refer to After Visit Summary for other counseling recommendations.  Return today (on 07/13/2020).   [place] Bing, MD

## 2020-07-27 ENCOUNTER — Other Ambulatory Visit: Payer: Self-pay

## 2020-07-27 ENCOUNTER — Ambulatory Visit (INDEPENDENT_AMBULATORY_CARE_PROVIDER_SITE_OTHER): Payer: Self-pay | Admitting: Obstetrics & Gynecology

## 2020-07-27 ENCOUNTER — Other Ambulatory Visit (HOSPITAL_COMMUNITY)
Admission: RE | Admit: 2020-07-27 | Discharge: 2020-07-27 | Disposition: A | Discharge status: Home / Self Care | Payer: Self-pay | Source: Ambulatory Visit | Attending: Obstetrics & Gynecology | Admitting: Obstetrics & Gynecology

## 2020-07-27 VITALS — BP 112/70 | HR 81 | Wt 189.6 lb

## 2020-07-27 DIAGNOSIS — O09299 Supervision of pregnancy with other poor reproductive or obstetric history, unspecified trimester: Secondary | ICD-10-CM

## 2020-07-27 DIAGNOSIS — O0990 Supervision of high risk pregnancy, unspecified, unspecified trimester: Secondary | ICD-10-CM | POA: Insufficient documentation

## 2020-07-27 DIAGNOSIS — O99820 Streptococcus B carrier state complicating pregnancy: Secondary | ICD-10-CM

## 2020-07-27 LAB — POCT URINALYSIS DIP (DEVICE)
Bilirubin Urine: NEGATIVE
Glucose, UA: NEGATIVE mg/dL
Hgb urine dipstick: NEGATIVE
Ketones, ur: NEGATIVE mg/dL
Leukocytes,Ua: NEGATIVE
Nitrite: NEGATIVE
Protein, ur: NEGATIVE mg/dL
Specific Gravity, Urine: 1.015 (ref 1.005–1.030)
Urobilinogen, UA: 0.2 mg/dL (ref 0.0–1.0)
pH: 6 (ref 5.0–8.0)

## 2020-07-27 NOTE — Patient Instructions (Signed)
Parto vaginal Vaginal Delivery  Parto vaginal significa que usted da a luz empujando al beb fuera del canal del parto (vagina). Un equipo de proveedores de atencin mdica la ayudar antes, durante y despus del parto vaginal. Las experiencias de los nacimientos son nicas para todas las mujeres, y cada embarazo y las experiencias de nacimiento varan segn dnde elija dar a luz. Qu ocurrir cuando llegue al centro de parto o al hospital? Una vez que se inicie el trabajo de parto y haya sido admitida en el hospital o centro de parto, el mdico podr hacer lo siguiente:  Revisar sus antecedentes de embarazo y cualquier inquietud que usted pueda tener.  Colocarle una va intravenosa en una de las venas. Esto se podr usar para administrarle lquidos y medicamentos.  Verificar su presin arterial, pulso, temperatura y frecuencia cardaca (signos vitales).  Verificar si la bolsa de agua (saco amnitico) se ha roto (ruptura).  Hablar con usted sobre su plan de nacimiento y analizar las opciones para controlar el dolor. Monitoreo Su mdico puede monitorear las contracciones (monitoreo uterino) y la frecuencia cardaca del beb (monitoreo fetal). Es posible que el monitoreo se necesite realizar:  Con frecuencia, pero no continuamente (intermitentemente).  Todo el tiempo o durante largos perodos a la vez (continuamente). El monitoreo continuo puede ser necesario si: ? Est recibiendo determinados medicamentos, tales como medicamentos para aliviar el dolor o para hacer que las contracciones sean ms fuertes. ? Tiene complicaciones durante el embarazo o el trabajo de parto. El monitoreo se puede realizar:  Al colocar un estetoscopio especial o un dispositivo manual de monitoreo en el abdomen o verificar los latidos cardacos del beb y comprobar las contracciones.  Al colocar monitores en el abdomen (monitores externos) para registrar los latidos cardacos del beb y la frecuencia y duracin de  las contracciones.  Al colocar monitores dentro del tero a travs de la vagina (monitores internos) para registrar los latidos cardacos del beb y la frecuencia, duracin y fuerza de sus contracciones. Segn el tipo de monitor, puede permanecer en el tero o en la cabeza del beb hasta el nacimiento.  Telemetra. Se trata de un tipo de monitoreo continuo que se puede realizar con monitores externos o internos. En lugar de tener que permanecer en la cama, usted puede moverse durante la telemetra. Examen fsico Su mdico puede realizar exmenes fsicos frecuentes. Esto puede incluir lo siguiente:  Verificar cmo y dnde el beb est ubicado en el tero.  Verificar el cuello uterino para determinar: ? Si se est afinando o estirando (borrando). ? Si se est abriendo (dilatando). Qu sucede durante el trabajo de parto y el parto?  El trabajo de parto y el parto normales se dividen en tres etapas: Etapa 1  Esta es la etapa ms larga del trabajo de parto.  Esta etapa puede durar horas o das.  Durante esta etapa, sentir contracciones. En general, las contracciones son leves, infrecuentes e irregulares al principio. Se hacen ms fuertes, ms frecuentes (aproximadamente cada 2 o 3 minutos) y ms regulares a medida que avanza en esta etapa.  Esta etapa finaliza cuando el cuello uterino est completamente dilatado hasta 4 pulgadas (10cm) y completamente borrado. Etapa 2  Esta etapa comienza una vez que el cuello uterino est totalmente borrado y dilatado, y dura hasta el nacimiento del beb.  Esta etapa puede durar de 20 minutos a 2 horas.  Esta es la etapa en la que va a sentir ganas de pujar al beb fuera de la   vagina.  Puede sentir un dolor urente y por estiramiento, especialmente cuando la parte ms ancha de la cabeza del beb pasa a travs de la abertura vaginal (coronacin).  Una vez que el beb nace, el cordn umbilical se pinzar y se cortar. Esto ocurre por lo general despus  de un perodo de 1 a 2 minutos despus del parto.  Colocarn al beb sobre su pecho desnudo (contacto piel con piel) en una posicin erguida y cubierto con una manta abrigada. Observe al beb para detectar seales de hambre, como el reflejo de bsqueda o succin, y acrquelo al pecho para su primera alimentacin. Etapa 3  Esta etapa comienza inmediatamente despus del nacimiento del beb y finaliza despus de la expulsin de la placenta.  Esta etapa puede durar de 5 a 30 minutos.  Despus del nacimiento del beb, puede sentir contracciones cuando el cuerpo expulsa la placenta y el tero se contrae para controlar la hemorragia. Qu puedo esperar despus del trabajo de parto y el parto?  Una vez que termine el trabajo de parto, se los controlar a usted y al beb atentamente para tener la seguridad de que ambos estn sanos y listos para ir a casa. Su equipo de atencin mdica le ensear cmo cuidarse y cuidar a su beb.  Usted y el beb permanecern en la misma habitacin (cohabitacin) durante su estada en el hospital. Esto estimular una vinculacin temprana y una lactancia exitosa.  Puede seguir recibiendo lquidos o medicamentos por va intravenosa.  Se le controlar y masajear el tero con regularidad (masaje fndico).  Tendr algo de inflamacin y dolor en el abdomen, la vagina y la zona de la piel entre la abertura vaginal y el ano (perineo).  Si se le realiz una incisin cerca de la vagina (episiotoma) o si ha tenido algn desgarro durante el parto, podran indicarle que se coloque compresas fras sobre la episiotoma o el desgarro. Esto ayuda a aliviar el dolor y la hinchazn.  Es posible que le den una botella rociadora para que use cuando vaya al bao para higienizarse. Siga los pasos a continuacin para usar la botella rociadora: ? Antes de orinar, llene la botella rociadora con agua tibia. No use agua caliente. ? Despus de orinar, mientras an est sentada en el inodoro,  use la botella rociadora para enjuagar el rea alrededor de la uretra y la abertura vaginal. Con esto podr limpiar cualquier rastro de orina y sangre. ? Llene la botella rociadora con agua limpia cada vez que vaya al bao.  Es normal tener hemorragia vaginal despus del parto. Use un apsito sanitario para el sangrado vaginal y secrecin. Resumen  Parto vaginal significa que usted dar a luz empujando al beb fuera del canal del parto (vagina).  Su mdico puede monitorear las contracciones (monitoreo uterino) y la frecuencia cardaca del beb (monitoreo fetal).  Su mdico puede realizarle un examen fsico.  El trabajo de parto y el parto normales se dividen en tres etapas.  Una vez que termina el trabajo de parto, se los controlar a usted y al beb atentamente hasta que estn listos para ir a casa. Esta informacin no tiene como fin reemplazar el consejo del mdico. Asegrese de hacerle al mdico cualquier pregunta que tenga. Document Revised: 02/19/2018 Document Reviewed: 02/19/2018 Elsevier Patient Education  2020 Elsevier Inc.  

## 2020-07-27 NOTE — Addendum Note (Signed)
Addended by: Jill Side on: 07/27/2020 12:29 PM   Modules accepted: Orders

## 2020-07-27 NOTE — Progress Notes (Signed)
   PRENATAL VISIT NOTE  Subjective:  Bethany Brewer is a 34 y.o. [id number] at [redacted]w[redacted]d being seen today for ongoing prenatal care.  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  Patient reports no complaints.  Contractions: Irritability. Vag. Bleeding: None.  Movement: Present. Denies leaking of fluid.   The following portions of the patient's history were reviewed and updated as appropriate: allergies, current medications, past family history, past medical history, past social history, past surgical history and problem list.   Objective:   Vitals:   07/27/20 0910  BP: 112/70  Pulse: 81  Weight: 189 lb 9.6 oz (86 kg)    Fetal Status: Fetal Heart Rate (bpm): 143 Fundal Height: 37 cm Movement: Present  Presentation: Vertex  General:  Alert, oriented and cooperative. Patient is in no acute distress.  Skin: Skin is warm and dry. No rash noted.   Cardiovascular: Normal heart rate noted  Respiratory: Normal respiratory effort, no problems with respiration noted  Abdomen: Soft, gravid, appropriate for gestational age.  Pain/Pressure: Present     Pelvic: Cervical exam performed in the presence of a chaperone Dilation: 1.5 Effacement (%): 40 Station: -3  Extremities: Normal range of motion.  Edema: None  Mental Status: Normal mood and affect. Normal behavior. Normal judgment and thought content.   Assessment and Plan:  Pregnancy: [id number] at [redacted]w[redacted]d 1. Supervision of high risk pregnancy, antepartum Doing well  2. Previous child with multiple anomalies, antepartum, unspecified trimester Normal [place]  Term labor symptoms and general obstetric precautions including but not limited to vaginal bleeding, contractions, leaking of fluid and fetal movement were reviewed in detail with the  patient. Please refer to After Visit Summary for other counseling recommendations.   Return in about 1 week (around 08/03/2020).  No future appointments.  Scheryl Darter, MD

## 2020-07-28 LAB — GC/CHLAMYDIA PROBE AMP (~~LOC~~) NOT AT ARMC
Chlamydia: NEGATIVE
Comment: NEGATIVE
Comment: NORMAL
Neisseria Gonorrhea: NEGATIVE

## 2020-07-29 LAB — STREP GP B NAA: Strep Gp B NAA: POSITIVE — AB

## 2020-08-01 ENCOUNTER — Telehealth (INDEPENDENT_AMBULATORY_CARE_PROVIDER_SITE_OTHER): Payer: Self-pay | Admitting: Lactation Services

## 2020-08-01 DIAGNOSIS — O0990 Supervision of high risk pregnancy, unspecified, unspecified trimester: Secondary | ICD-10-CM

## 2020-08-01 NOTE — Telephone Encounter (Signed)
Called patient with assistance of [facility] Telephone Spanish Interpreter # [phone].   Patient was informed that her lab results were normal. Patient voiced understanding. Patient voiced no questions or concerns.

## 2020-08-01 NOTE — Telephone Encounter (Signed)
-----   Message from Adam Phenix, MD sent at 07/28/2020  4:42 PM EDT ----- Negative results

## 2020-08-10 ENCOUNTER — Ambulatory Visit (INDEPENDENT_AMBULATORY_CARE_PROVIDER_SITE_OTHER): Payer: Self-pay | Admitting: Obstetrics and Gynecology

## 2020-08-10 ENCOUNTER — Other Ambulatory Visit: Payer: Self-pay

## 2020-08-10 VITALS — BP 105/77 | HR 93 | Wt 191.4 lb

## 2020-08-10 DIAGNOSIS — Z3A39 39 weeks gestation of pregnancy: Secondary | ICD-10-CM

## 2020-08-10 DIAGNOSIS — O0990 Supervision of high risk pregnancy, unspecified, unspecified trimester: Secondary | ICD-10-CM

## 2020-08-10 DIAGNOSIS — Z789 Other specified health status: Secondary | ICD-10-CM

## 2020-08-10 NOTE — Progress Notes (Signed)
Prenatal Visit Note Date: 08/10/2020 Clinic: [facility] for [facility]  Subjective:  Bethany Brewer is a 34 y.o. [id number] at [redacted]w[redacted]d being seen today for ongoing prenatal care.  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  Patient reports no complaints.   Contractions: Irritability. Vag. Bleeding: None.  Movement: Present. Denies leaking of fluid.   The following portions of the patient's history were reviewed and updated as appropriate: allergies, current medications, past family history, past medical history, past social history, past surgical history and problem list. Problem list updated.  Objective:   Vitals:   08/10/20 1022  BP: 105/77  Pulse: 93  Weight: 191 lb 6.4 oz (86.8 kg)    Fetal Status: Fetal Heart Rate (bpm): 152 Fundal Height: 39 cm Movement: Present  Presentation: Vertex  General:  Alert, oriented and cooperative. Patient is in no acute distress.  Skin: Skin is warm and dry. No rash noted.   Cardiovascular: Normal heart rate noted  Respiratory: Normal respiratory effort, no problems with respiration noted  Abdomen: Soft, gravid, appropriate for gestational age. Pain/Pressure: Present     Pelvic:  Cervical exam deferred        Extremities: Normal range of motion.  Edema: None  Mental Status: Normal mood and affect. Normal behavior. Normal judgment and thought content.   Urinalysis:      Assessment and Plan:  Pregnancy: [id number] at [redacted]w[redacted]d  1. Language barrier interpeter used  2. Supervision of high risk pregnancy, antepartum rtc 5-7 and set up PDIOL. D/w her re: rationale for this  Term labor symptoms and general obstetric precautions including but not limited to vaginal bleeding, contractions, leaking of fluid and fetal movement were reviewed  in detail with the patient. Please refer to After Visit Summary for other counseling recommendations.  Return in about 1 week (around 08/17/2020) for 5-7d, low risk, in person.   Maunaloa Bing, MD

## 2020-08-11 ENCOUNTER — Encounter (HOSPITAL_COMMUNITY): Payer: Self-pay | Admitting: Obstetrics and Gynecology

## 2020-08-11 ENCOUNTER — Other Ambulatory Visit: Payer: Self-pay

## 2020-08-11 ENCOUNTER — Inpatient Hospital Stay (HOSPITAL_COMMUNITY)
Admission: AD | Admit: 2020-08-11 | Discharge: 2020-08-13 | DRG: 807 | Disposition: A | Discharge status: Home / Self Care | Payer: Medicaid Other | Attending: Obstetrics and Gynecology | Admitting: Obstetrics and Gynecology

## 2020-08-11 DIAGNOSIS — Z20822 Contact with and (suspected) exposure to covid-19: Secondary | ICD-10-CM | POA: Diagnosis present

## 2020-08-11 DIAGNOSIS — Z3A39 39 weeks gestation of pregnancy: Secondary | ICD-10-CM

## 2020-08-11 DIAGNOSIS — O0990 Supervision of high risk pregnancy, unspecified, unspecified trimester: Secondary | ICD-10-CM

## 2020-08-11 DIAGNOSIS — O4292 Full-term premature rupture of membranes, unspecified as to length of time between rupture and onset of labor: Principal | ICD-10-CM | POA: Diagnosis present

## 2020-08-11 DIAGNOSIS — Z789 Other specified health status: Secondary | ICD-10-CM

## 2020-08-11 DIAGNOSIS — O99824 Streptococcus B carrier state complicating childbirth: Secondary | ICD-10-CM | POA: Diagnosis present

## 2020-08-11 LAB — CBC
HCT: 37.1 % (ref 36.0–46.0)
Hemoglobin: 12.1 g/dL (ref 12.0–15.0)
MCH: 29.9 pg (ref 26.0–34.0)
MCHC: 32.6 g/dL (ref 30.0–36.0)
MCV: 91.6 fL (ref 80.0–100.0)
Platelets: 187 10*3/uL (ref 150–400)
RBC: 4.05 MIL/uL (ref 3.87–5.11)
RDW: 14.3 % (ref 11.5–15.5)
WBC: 10.3 10*3/uL (ref 4.0–10.5)
nRBC: 0 % (ref 0.0–0.2)

## 2020-08-11 LAB — TYPE AND SCREEN
ABO/RH(D): O POS
Antibody Screen: NEGATIVE

## 2020-08-11 LAB — SARS CORONAVIRUS 2 BY RT PCR (HOSPITAL ORDER, PERFORMED IN ~~LOC~~ HOSPITAL LAB): SARS Coronavirus 2: NEGATIVE

## 2020-08-11 LAB — POCT FERN TEST: POCT Fern Test: POSITIVE

## 2020-08-11 LAB — HIV ANTIBODY (ROUTINE TESTING W REFLEX): HIV Screen 4th Generation wRfx: NONREACTIVE

## 2020-08-11 MED ORDER — TETANUS-DIPHTH-ACELL PERTUSSIS 5-2.5-18.5 LF-MCG/0.5 IM SUSP: 0.5000 mL | Freq: Once | INTRAMUSCULAR | Status: DC

## 2020-08-11 MED ORDER — ACETAMINOPHEN 325 MG PO TABS: 650.0000 mg | ORAL_TABLET | ORAL | Status: DC | PRN

## 2020-08-11 MED ORDER — LACTATED RINGERS IV SOLN: 500.0000 mL | INTRAVENOUS | Status: DC | PRN

## 2020-08-11 MED ORDER — ZOLPIDEM TARTRATE 5 MG PO TABS: 5.0000 mg | ORAL_TABLET | Freq: Every evening | ORAL | Status: DC | PRN

## 2020-08-11 MED ORDER — ONDANSETRON HCL 4 MG/2ML IJ SOLN: 4.0000 mg | Freq: Four times a day (QID) | INTRAMUSCULAR | Status: DC | PRN

## 2020-08-11 MED ORDER — OXYCODONE-ACETAMINOPHEN 5-325 MG PO TABS: 2.0000 | ORAL_TABLET | ORAL | Status: DC | PRN

## 2020-08-11 MED ORDER — SOD CITRATE-CITRIC ACID 500-334 MG/5ML PO SOLN: 30.0000 mL | ORAL | Status: DC | PRN

## 2020-08-11 MED ORDER — IBUPROFEN 600 MG PO TABS
600.0000 mg | ORAL_TABLET | Freq: Four times a day (QID) | ORAL | Status: DC
  Administered 2020-08-11 – 2020-08-13 (×8): 600 mg via ORAL
  Filled 2020-08-11 (×8): qty 1

## 2020-08-11 MED ORDER — WITCH HAZEL-GLYCERIN EX PADS: 1.0000 "application " | MEDICATED_PAD | CUTANEOUS | Status: DC | PRN

## 2020-08-11 MED ORDER — SIMETHICONE 80 MG PO CHEW: 80.0000 mg | CHEWABLE_TABLET | ORAL | Status: DC | PRN

## 2020-08-11 MED ORDER — SENNOSIDES-DOCUSATE SODIUM 8.6-50 MG PO TABS
2.0000 | ORAL_TABLET | ORAL | Status: DC
  Administered 2020-08-11 – 2020-08-12 (×2): 2 via ORAL
  Filled 2020-08-11 (×2): qty 2

## 2020-08-11 MED ORDER — ACETAMINOPHEN 325 MG PO TABS
650.0000 mg | ORAL_TABLET | ORAL | Status: DC | PRN
  Administered 2020-08-11 – 2020-08-12 (×5): 650 mg via ORAL
  Filled 2020-08-11 (×5): qty 2

## 2020-08-11 MED ORDER — DIBUCAINE (PERIANAL) 1 % EX OINT: 1.0000 "application " | TOPICAL_OINTMENT | CUTANEOUS | Status: DC | PRN

## 2020-08-11 MED ORDER — PRENATAL MULTIVITAMIN CH
1.0000 | ORAL_TABLET | Freq: Every day | ORAL | Status: DC
  Administered 2020-08-11 – 2020-08-12 (×2): 1 via ORAL
  Filled 2020-08-11 (×2): qty 1

## 2020-08-11 MED ORDER — COCONUT OIL OIL
1.0000 "application " | TOPICAL_OIL | Status: DC | PRN
  Administered 2020-08-12: 1 via TOPICAL

## 2020-08-11 MED ORDER — SODIUM CHLORIDE 0.9 % IV SOLN
2.0000 g | Freq: Once | INTRAVENOUS | Status: AC
  Administered 2020-08-11: 2 g via INTRAVENOUS
  Filled 2020-08-11: qty 2000

## 2020-08-11 MED ORDER — FENTANYL CITRATE (PF) 100 MCG/2ML IJ SOLN
50.0000 ug | INTRAMUSCULAR | Status: DC | PRN
  Administered 2020-08-11: 100 ug via INTRAVENOUS
  Filled 2020-08-11: qty 2

## 2020-08-11 MED ORDER — ONDANSETRON HCL 4 MG PO TABS: 4.0000 mg | ORAL_TABLET | ORAL | Status: DC | PRN

## 2020-08-11 MED ORDER — LIDOCAINE HCL (PF) 1 % IJ SOLN: 30.0000 mL | INTRAMUSCULAR | Status: DC | PRN

## 2020-08-11 MED ORDER — ONDANSETRON HCL 4 MG/2ML IJ SOLN: 4.0000 mg | INTRAMUSCULAR | Status: DC | PRN

## 2020-08-11 MED ORDER — FLEET ENEMA 7-19 GM/118ML RE ENEM: 1.0000 | ENEMA | RECTAL | Status: DC | PRN

## 2020-08-11 MED ORDER — BENZOCAINE-MENTHOL 20-0.5 % EX AERO
1.0000 "application " | INHALATION_SPRAY | CUTANEOUS | Status: DC | PRN
  Filled 2020-08-11: qty 56

## 2020-08-11 MED ORDER — LACTATED RINGERS IV SOLN: INTRAVENOUS | Status: DC

## 2020-08-11 MED ORDER — OXYCODONE-ACETAMINOPHEN 5-325 MG PO TABS: 1.0000 | ORAL_TABLET | ORAL | Status: DC | PRN

## 2020-08-11 MED ORDER — OXYTOCIN BOLUS FROM INFUSION
333.0000 mL | Freq: Once | INTRAVENOUS | Status: AC
  Administered 2020-08-11: 333 mL via INTRAVENOUS

## 2020-08-11 MED ORDER — DIPHENHYDRAMINE HCL 25 MG PO CAPS: 25.0000 mg | ORAL_CAPSULE | Freq: Four times a day (QID) | ORAL | Status: DC | PRN

## 2020-08-11 MED ORDER — OXYTOCIN-SODIUM CHLORIDE 30-0.9 UT/500ML-% IV SOLN
2.5000 [IU]/h | INTRAVENOUS | Status: DC
  Administered 2020-08-11: 2.5 [IU]/h via INTRAVENOUS
  Filled 2020-08-11: qty 500

## 2020-08-11 NOTE — MAU Note (Signed)
Bethany Brewer is a 34 y.o. at [redacted]w[redacted]d here in MAU reporting: contractions that come and go  With possible LOF denies VB, +FM  Onset of complaint: yesterday morning Pain score: 7 Vitals:   08/11/20 0447  BP: 115/75  Pulse: 80  Resp: 16  Temp: 97.8 F (36.6 C)  SpO2: 100%     FHT 132

## 2020-08-11 NOTE — Progress Notes (Signed)
I was present during the delivery with Dr Theron Arista and Dr Barb Merino by Orlan Leavens Spanish Interpreter.

## 2020-08-11 NOTE — Discharge Summary (Signed)
Postpartum Discharge Summary    Patient Name: Bethany Brewer DOB: 1986-08-24 MRN: [id number]  Date of admission: 08/11/2020 Delivery date:08/11/2020  Delivering provider: Matilde Haymaker  Date of discharge: 08/12/2020  Admitting diagnosis: Normal labor [O80, Z37.9] Intrauterine pregnancy: [redacted]w[redacted]d    Secondary diagnosis:  Principal Problem:   Vaginal delivery  Additional problems: h/o fetal anomaly    Discharge diagnosis: Term Pregnancy Delivered                                              Post partum procedures:none Augmentation: none Complications: None  Hospital course: Onset of Labor With Vaginal Delivery      34y.o. yo [id number] admitted in Active Labor on 08/11/2020. Patient had an uncomplicated labor course as follows:  Membrane Rupture Time/Date: 3:00 AM ,08/11/2020   Delivery Method:Vaginal, Spontaneous  Episiotomy: None  Lacerations:  None  Patient had an uncomplicated postpartum course.  She is ambulating, tolerating a regular diet, passing flatus, and urinating well. Patient is discharged home in stable condition on 08/12/20.  Newborn Data: Birth date:08/11/2020  Birth time:9:05 AM  Gender:Female  Living status:Living  Apgars:9 ,9  Weight:3445 g   Magnesium Sulfate received: No BMZ received: No Rhophylac:N/A MMR:N/A T-DaP:Given prenatally Flu: No Transfusion:No  Physical exam  Vitals:   08/11/20 1705 08/11/20 2027 08/12/20 0037 08/12/20 0510  BP: 100/75 120/80 103/65 113/76  Pulse: 66 62 70 68  Resp: '18 18 18 18  ' Temp: 98.2 F (36.8 C) 98 F (36.7 C) 98 F (36.7 C) 98 F (36.7 C)  TempSrc: Oral Oral Oral Oral  SpO2: 99% 100% 100% 99%  Weight:      Height:       General: alert, cooperative and no distress Lochia: appropriate Uterine Fundus: firm Incision: N/A DVT Evaluation: No evidence of DVT seen on physical exam. No cords or calf tenderness. No significant calf/ankle edema. Labs: Lab Results  Component Value Date   WBC 10.3  08/11/2020   HGB 12.1 08/11/2020   HCT 37.1 08/11/2020   MCV 91.6 08/11/2020   PLT 187 08/11/2020   CMP Latest Ref Rng & Units 03/20/2018  Glucose 65 - 99 mg/dL 100(H)  BUN 6 - 20 mg/dL 10  Creatinine 0.44 - 1.00 mg/dL 0.66  Sodium 135 - 145 mmol/L 140  Potassium 3.5 - 5.1 mmol/L 3.7  Chloride 101 - 111 mmol/L 109  CO2 22 - 32 mmol/L 23  Calcium 8.9 - 10.3 mg/dL 8.8(L)  Total Protein 6.5 - 8.1 g/dL 7.3  Total Bilirubin 0.3 - 1.2 mg/dL 0.8  Alkaline Phos 38 - 126 U/L 88  AST 15 - 41 U/L 21  ALT 14 - 54 U/L 17   Edinburgh Score: Edinburgh Postnatal Depression Scale Screening Tool 08/11/2020  I have been able to laugh and see the funny side of things. 0  I have looked forward with enjoyment to things. 0  I have blamed myself unnecessarily when things went wrong. 0  I have been anxious or worried for no good reason. 0  I have felt scared or panicky for no good reason. 0  Things have been getting on top of me. 1  I have been so unhappy that I have had difficulty sleeping. 0  I have felt sad or miserable. 0  I have been so unhappy that I have been crying. 0  The  thought of harming myself has occurred to me. 0  Edinburgh Postnatal Depression Scale Total 1     After visit meds:  Allergies as of 08/12/2020   No Known Allergies     Medication List    STOP taking these medications   FOLIC ACID PO     TAKE these medications   acetaminophen 325 MG tablet Commonly known as: Tylenol Take 2 tablets (650 mg total) by mouth every 6 (six) hours.   CALCIUM PO Take 1 tablet by mouth daily.   coconut oil Oil Apply 1 application topically as needed (for nipple pain).   ibuprofen 600 MG tablet Commonly known as: ADVIL Take 1 tablet (600 mg total) by mouth every 8 (eight) hours as needed for moderate pain or cramping.   norethindrone 0.35 MG tablet Commonly known as: Ortho Micronor Take 1 tablet (0.35 mg total) by mouth daily.   PRENATAL VITAMIN PO Take 1 tablet by mouth at  bedtime.        Discharge home in stable condition Infant Feeding: Breast Infant Disposition:home with mother Discharge instruction: per After Visit Summary and Postpartum booklet. Activity: Advance as tolerated. Pelvic rest for 6 weeks.  Diet: routine diet Future Appointments: Future Appointments  Date Time Provider [place]  09/12/2020 10:55 AM [place] Hoback, Gildardo Cranker, MD [facility]   Follow up Visit:   Please schedule this patient for a In person postpartum visit in 4 weeks with the following provider: Any provider. Additional Postpartum F/U:none  Low risk pregnancy complicated by: none Delivery mode:  Vaginal, Spontaneous  Anticipated Birth Control:  POPs > OCPs   08/12/2020 Randa Ngo, MD

## 2020-08-11 NOTE — H&P (Addendum)
OBSTETRIC ADMISSION HISTORY AND PHYSICAL  Bethany Brewer is a 34 y.o. female [phone] with IUP at [redacted]w[redacted]d by L=13 presenting after PROM around 0200. She reports +FMs, no VB, no blurry vision, headaches or peripheral edema, and RUQ pain.  She plans on breast feeding. She requests OCPs for birth control. She received her prenatal care at [facility]   Dating: By 13 week [place] --->  Estimated Date of Delivery: 08/17/20  Sono:    @[redacted]w[redacted]d , +5 days, normal anatomy, cephalic presentation, posterior placenta, 2392g, 73% EFW, AFI WNL   Prenatal History/Complications:  - History of previous child with multiple anomalies -- normal, declined genetic testing  Past Medical History: Past Medical History:  Diagnosis Date  . Medical history non-contributory     Past Surgical History: Past Surgical History:  Procedure Laterality Date  . CYST REMOVAL TRUNK  2005   states was near belly button    Obstetrical History: OB History    Gravida  5   Para  2   Term  2   Preterm  0   AB  2   Living  1     SAB  1   TAB  1   Ectopic      Multiple  0   Live Births  2           Social History Social History   Socioeconomic History  . Marital status: Single    Spouse name: Not on file  . Number of children: Not on file  . Years of education: Not on file  . Highest education level: Not on file  Occupational History  . Not on file  Tobacco Use  . Smoking status: Never Smoker  . Smokeless tobacco: Never Used  Vaping Use  . Vaping Use: Never used  Substance and Sexual Activity  . Alcohol use: Not Currently    Comment: only on special occasion   . Drug use: No  . Sexual activity: Yes    Birth control/protection: None  Other Topics Concern  . Not on file  Social History Narrative  . Not on file   Social Determinants of Health   Financial Resource Strain:   . Difficulty of Paying Living Expenses: Not on file  Food Insecurity: No Food Insecurity  . Worried About 2006 in the Last Year: Never true  . Ran Out of Food in the Last Year: Never true  Transportation Needs: No Transportation Needs  . Lack of Transportation (Medical): No  . Lack of Transportation (Non-Medical): No  Physical Activity:   . Days of Exercise per Week: Not on file  . Minutes of Exercise per Session: Not on file  Stress:   . Feeling of Stress : Not on file  Social Connections:   . Frequency of Communication with Friends and Family: Not on file  . Frequency of Social Gatherings with Friends and Family: Not on file  . Attends Religious Services: Not on file  . Active Member of Clubs or Organizations: Not on file  . Attends Patent examiner Meetings: Not on file  . Marital Status: Not on file    Family History: Family History  Problem Relation Age of Onset  . Birth defects Son        polydactyly, bump on skull, small lungs    Allergies: No Known Allergies  Medications Prior to Admission  Medication Sig Dispense Refill Last Dose  . CALCIUM PO Take by mouth daily.   08/10/2020 at  Unknown time  . FOLIC ACID PO Take by mouth daily.   08/10/2020 at Unknown time  . Prenatal Vit-Fe Fumarate-FA (PRENATAL VITAMIN PO) Take 1 tablet by mouth at bedtime.    08/10/2020 at Unknown time     Review of Systems   All systems reviewed and negative except as stated in HPI  Blood pressure 131/80, pulse 73, temperature 97.8 F (36.6 C), temperature source Oral, resp. rate 16, height 4\' 11"  (1.499 m), weight 86.6 kg, last menstrual period 11/08/2019, SpO2 99 %, currently breastfeeding. General appearance: alert, cooperative and no distress Lungs: clear to auscultation bilaterally Heart: regular rate and rhythm Abdomen: soft, non-tender; bowel sounds normal Pelvic: 2/80/-2 Extremities: Nontender, no sign of DVT Presentation: cephalic Fetal monitoringBaseline: 135 bpm, Variability: Good {> 6 bpm), Accelerations: Reactive and Decelerations: Absent Uterine activityFrequency: Every  2-3 minutes Dilation: 2 Effacement (%): 80 Station: -2 Exam by:: [email or web address], CNM   Prenatal labs: ABO, Rh: O/Positive/-- (02/24 1537) Antibody: Negative (06/07 0911) Rubella: 2.85 (02/24 1537) RPR: Non Reactive (06/07 0901)  HBsAg: Negative (02/24 1537)  HIV: Non Reactive (06/07 0901)  GBS: Positive/-- (08/04 1328)  2 hr Glucola passed Genetic screening  declined Anatomy 03-15-2005 normal  Prenatal Transfer Tool  Maternal Diabetes: No Genetic Screening: Declined Maternal Ultrasounds/Referrals: Normal Fetal Ultrasounds or other Referrals:  Referred to Materal Fetal Medicine  Maternal Substance Abuse:  No Significant Maternal Medications:  None Significant Maternal Lab Results: Group B Strep positive  Results for orders placed or performed during the hospital encounter of 08/11/20 (from the past 24 hour(s))  POCT fern test   Collection Time: 08/11/20  5:20 AM  Result Value Ref Range   POCT Fern Test Positive = ruptured amniotic membanes     Patient Active Problem List   Diagnosis Date Noted  . Supervision of high risk pregnancy, antepartum 02/10/2020  . Language barrier 02/10/2020  . Normal labor 12/13/2016  . Previous child with multiple anomalies, antepartum, unspecified trimester 09/20/2016    Assessment/Plan:  Bethany Brewer is a 34 y.o. 38 at [redacted]w[redacted]d here for PROM around 0200.  #Labor: Initial check 2/80/-2 with regular contractions, will defer labor augmentation at this time. Will consider augmentation at next check if no cervical change. #Pain: Declines #FWB: Cat I #ID: GBS positive, given Ampicillin due to concern for rapid labor #MOF: Breast #MOC: OCPs #Circ: N/A   [redacted]w[redacted]d, MD  08/11/2020, 6:27 AM   GME ATTESTATION:  I saw and evaluated the patient. I agree with the findings and the plan of care as documented in the resident's note.  08/13/2020, MD OB Fellow, Faculty [facility], Center for [facility]  [facility]care 08/11/2020 7:38 AM

## 2020-08-11 NOTE — MAU Provider Note (Signed)
Chief Complaint:  Contractions    HPI  HPI: Bethany Brewer is a 34 y.o. [id number] at 18w1dwho presents to maternity admissions reporting leaking a small amount of watery discharge. She reports good fetal movement, denies vaginal bleeding, vaginal itching/burning, urinary symptoms, h/a, dizziness, n/v, diarrhea, constipation or fever/chills. .   Past Medical History: Past Medical History:  Diagnosis Date  . Medical history non-contributory     Past obstetric history: OB History  Gravida Para Term Preterm AB Living  5 2 2  0 2 1  SAB TAB Ectopic Multiple Live Births  1 1   0 2    # Outcome Date GA Lbr Len/2nd Weight Sex Delivery Anes PTL Lv  5 Current           4 Term 12/13/16 106w3d 03:31 / 00:05 3510 g M Vag-Spont None  LIV     Birth Comments: wnl  3 TAB 2012    F         Birth Comments: "same problems the other baby had"  2 Term 2010    M Vag-Spont   ND     Birth Comments: polydactyly, "bump on back of skull," small lungs; lived 5 hours  1 SAB 2008 [redacted]w[redacted]d           Past Surgical History: Past Surgical History:  Procedure Laterality Date  . CYST REMOVAL TRUNK  2005   states was near belly button    Family History: Family History  Problem Relation Age of Onset  . Birth defects Son        polydactyly, bump on skull, small lungs    Social History: Social History   Tobacco Use  . Smoking status: Never Smoker  . Smokeless tobacco: Never Used  Vaping Use  . Vaping Use: Never used  Substance Use Topics  . Alcohol use: Not Currently    Comment: only on special occasion   . Drug use: No    Allergies: No Known Allergies  Meds:  Medications Prior to Admission  Medication Sig Dispense Refill Last Dose  . CALCIUM PO Take by mouth daily.   08/10/2020 at Unknown time  . FOLIC ACID PO Take by mouth daily.   08/10/2020 at Unknown time  . Prenatal Vit-Fe Fumarate-FA (PRENATAL VITAMIN PO) Take 1 tablet by mouth at bedtime.    08/10/2020 at Unknown time    I have  reviewed patient's Past Medical Hx, Surgical Hx, Family Hx, Social Hx, medications and allergies.   ROS:  Review of Systems Other systems negative  Physical Exam   Patient Vitals for the past 24 hrs:  BP Temp Temp src Pulse Resp SpO2  08/11/20 0503 114/70 -- -- 72 -- --  08/11/20 0500 -- -- -- -- -- 99 %  08/11/20 0447 115/75 97.8 F (36.6 C) Oral 80 16 100 %   Constitutional: Well-developed, well-nourished female in no acute distress.  Cardiovascular: normal rate and rhythm Respiratory: normal effort, clear to auscultation bilaterally GI: Abd soft, non-tender, gravid appropriate for gestational age.   No rebound or guarding. MS: Extremities nontender, no edema, normal ROM Neurologic: Alert and oriented x 4.  GU: Neg CVAT.  PELVIC EXAM: Cervix pink, visually closed, without lesion, small amount of watery fluid in vault, + fern Dilation: 2 Effacement (%): 80 Station: -2 Presentation: Vertex Exam by:: [email or web address], CNM  FHT:  Baseline 140 , moderate variability, accelerations present, no decelerations Contractions: Irregular     Labs: Results for orders placed or performed during the  hospital encounter of 08/11/20 (from the past 24 hour(s))  POCT fern test     Status: Abnormal   Collection Time: 08/11/20  5:20 AM  Result Value Ref Range   POCT Fern Test Positive = ruptured amniotic membanes    O/Positive/-- (02/24 1537)  Imaging:  No results found.  MAU Course/MDM: I have ordered labs and reviewed results. Fern + NST reviewed Assessment: Single IUP at [redacted]w[redacted]d PROM at term Latent labor  Plan: Admit Plan per labor team   Wynelle Bourgeois CNM, MSN Certified Nurse-Midwife 08/11/2020 5:25 AM

## 2020-08-11 NOTE — Discharge Instructions (Signed)

## 2020-08-12 MED ORDER — NORETHINDRONE 0.35 MG PO TABS: 1.0000 | ORAL_TABLET | Freq: Every day | ORAL | 6 refills | Status: DC

## 2020-08-12 MED ORDER — ACETAMINOPHEN 325 MG PO TABS: 650.0000 mg | ORAL_TABLET | Freq: Four times a day (QID) | ORAL | Status: AC

## 2020-08-12 MED ORDER — COCONUT OIL OIL: 1.0000 "application " | TOPICAL_OIL | 0 refills | Status: AC | PRN

## 2020-08-12 MED ORDER — IBUPROFEN 600 MG PO TABS: 600.0000 mg | ORAL_TABLET | Freq: Three times a day (TID) | ORAL | 0 refills | Status: AC | PRN

## 2020-08-13 NOTE — Discharge Summary (Signed)
Postpartum Discharge Summary    Patient Name: Bethany Brewer DOB: March 20, 1986 MRN: [id number]  Date of admission: 08/11/2020 Delivery date:08/11/2020  Delivering provider: Matilde Haymaker  Date of discharge: 08/13/2020  Admitting diagnosis: Normal labor [O80, Z37.9] Intrauterine pregnancy: [redacted]w[redacted]d    Secondary diagnosis:  Principal Problem:   Vaginal delivery  Additional problems: n/a    Discharge diagnosis: Term Pregnancy Delivered                                              Post partum procedures:n/a Augmentation: N/A Complications: None  Hospital course: Onset of Labor With Vaginal Delivery      34y.o. yo [id number] 34w1das admitted in Latent Labor on 08/11/2020. Patient had an uncomplicated labor course as follows:  Membrane Rupture Time/Date: 3:00 AM ,08/11/2020   Delivery Method:Vaginal, Spontaneous  Episiotomy: None  Lacerations:  None  Patient had an uncomplicated postpartum course.  She is ambulating, tolerating a regular diet, passing flatus, and urinating well. Patient is discharged home in stable condition on 08/13/20.  Newborn Data: Birth date:08/11/2020  Birth time:9:05 AM  Gender:Female  Living status:Living  Apgars:9 ,9  Weight:3445 g   Magnesium Sulfate received: No BMZ received: No Rhophylac:N/A MMR:N/A T-DaP:Given prenatally Flu: N/A Transfusion:No  Physical exam  Vitals:   08/12/20 0510 08/12/20 1716 08/12/20 2116 08/13/20 0511  BP: 113/76 107/76 91/61 111/82  Pulse: 68 63 78 66  Resp: '18 17 18 18  ' Temp: 98 F (36.7 C) 98.6 F (37 C) 98.4 F (36.9 C) 98 F (36.7 C)  TempSrc: Oral Oral Oral   SpO2: 99%  98% 100%  Weight:      Height:       General: alert, cooperative and no distress Lochia: appropriate Uterine Fundus: firm Incision: N/A DVT Evaluation: No evidence of DVT seen on physical exam. Labs: Lab Results  Component Value Date   WBC 10.3 08/11/2020   HGB 12.1 08/11/2020   HCT 37.1 08/11/2020   MCV 91.6 08/11/2020    PLT 187 08/11/2020   CMP Latest Ref Rng & Units 03/20/2018  Glucose 65 - 99 mg/dL 100(H)  BUN 6 - 20 mg/dL 10  Creatinine 0.44 - 1.00 mg/dL 0.66  Sodium 135 - 145 mmol/L 140  Potassium 3.5 - 5.1 mmol/L 3.7  Chloride 101 - 111 mmol/L 109  CO2 22 - 32 mmol/L 23  Calcium 8.9 - 10.3 mg/dL 8.8(L)  Total Protein 6.5 - 8.1 g/dL 7.3  Total Bilirubin 0.3 - 1.2 mg/dL 0.8  Alkaline Phos 38 - 126 U/L 88  AST 15 - 41 U/L 21  ALT 14 - 54 U/L 17   Edinburgh Score: Edinburgh Postnatal Depression Scale Screening Tool 08/11/2020  I have been able to laugh and see the funny side of things. 0  I have looked forward with enjoyment to things. 0  I have blamed myself unnecessarily when things went wrong. 0  I have been anxious or worried for no good reason. 0  I have felt scared or panicky for no good reason. 0  Things have been getting on top of me. 1  I have been so unhappy that I have had difficulty sleeping. 0  I have felt sad or miserable. 0  I have been so unhappy that I have been crying. 0  The thought of harming myself has occurred to me.  0  Edinburgh Postnatal Depression Scale Total 1     After visit meds:  Allergies as of 08/13/2020   No Known Allergies     Medication List    STOP taking these medications   FOLIC ACID PO     TAKE these medications   acetaminophen 325 MG tablet Commonly known as: Tylenol Take 2 tablets (650 mg total) by mouth every 6 (six) hours.   CALCIUM PO Take 1 tablet by mouth daily.   coconut oil Oil Apply 1 application topically as needed (for nipple pain).   ibuprofen 600 MG tablet Commonly known as: ADVIL Take 1 tablet (600 mg total) by mouth every 8 (eight) hours as needed for moderate pain or cramping.   norethindrone 0.35 MG tablet Commonly known as: Ortho Micronor Take 1 tablet (0.35 mg total) by mouth daily.   PRENATAL VITAMIN PO Take 1 tablet by mouth at bedtime.        Discharge home in stable condition Infant Feeding:  Breast Infant Disposition:home with mother Discharge instruction: per After Visit Summary and Postpartum booklet. Activity: Advance as tolerated. Pelvic rest for 6 weeks.  Diet: routine diet Future Appointments: Future Appointments  Date Time Provider [place]  09/12/2020 10:55 AM [place], Gildardo Cranker, MD [facility]   Follow up Visit:  [place] [facility] [place] [facility] Follow up.   Specialty: Obstetrics and Gynecology Why: In 4 weeks for a postpartum exam Contact information: [place] [id number] [phone]               08/13/2020 Wende Mott, CNM

## 2020-08-13 NOTE — Lactation Note (Signed)
This note was copied from a baby's chart. Lactation Consultation Note  Patient Name: Bethany Brewer OVZCH'Y Date: 08/13/2020 Reason for consult: Initial assessment;Term  Initial visit to 70 hours old baby Bethany of a P2 mother with breastfeeding experience. Baby is sleeping in mother's arms upon arrival. Mother states breastfeeding is going well and only reports difficulty with latch. Mother explains her nipples are "too big for baby's mouth". Encouraged mother to unswaddle baby to check for hunger cues in order to observe a latch. Baby started cueing immediately and mother demonstrated a shallow latch and sub-optimal position. Discussed with mother the use of pillows for support to discourage infant from slipping into a shallow latch. Demonstrated a deep latch going chin first. Showed mother how to [place] baby and reattempt for a deeper latch. Mother demonstrated a better latch. Baby sucks and swallow with rhythm and noted flanged lips. Observed good breast tissue movement. Mother requests a manual breast pump to use for supplementation.  Discussed milk coming to volume since mother's breasts feel firmer and fuller. Mother is aware of offering the breast 8 to 12 times in 24-hours. Reviewed with mother average size of a NB stomach and babies' hunger and fullness cues. Talked about expected diaper count.  Reviewed newborn behavior and expectations with mother and encouraged to contact [facility] for support, questions or concerns.    All questions answered at this time. Consult was done in [place].  Maternal Data Formula Feeding for Exclusion: No Has patient been taught Hand Expression?: No Does the patient have breastfeeding experience prior to this delivery?: Yes  Feeding Feeding Type: Breast Fed  LATCH Score Latch: Grasps breast easily, tongue down, lips flanged, rhythmical sucking.  Audible Swallowing: Spontaneous and intermittent  Type of Nipple: Everted at rest and after  stimulation  Comfort (Breast/Nipple): Soft / non-tender  Hold (Positioning): Assistance needed to correctly position infant at breast and maintain latch.  LATCH Score: 9  Interventions Interventions: Breast feeding basics reviewed;Assisted with latch;Hand express;Adjust position;Support pillows;Position options;Hand pump  Lactation Tools Discussed/Used WIC Program: Yes   Consult Status Consult Status: Follow-up Date: 08/14/20 Follow-up type: Call as needed    Xoey Warmoth A Higuera Ancidey 08/13/2020, 10:03 AM

## 2020-08-14 LAB — RPR: RPR Ser Ql: NONREACTIVE — AB

## 2020-08-17 ENCOUNTER — Encounter: Payer: Self-pay | Admitting: Family Medicine

## 2020-08-31 IMAGING — US US OB COMP LESS 14 WK
1 series · 15 of 28 positions shown · non-contrast
Comparison: None.

CLINICAL DATA: Unsure of LMP.

EXAM:
OBSTETRIC <14 WK ULTRASOUND
TECHNIQUE: Transabdominal ultrasound was performed for evaluation of the
gestation as well as the maternal uterus and adnexal regions.

[Series 1: us ob comp less 14 wk · 15 of 37 slices shown]
[im 1/37]
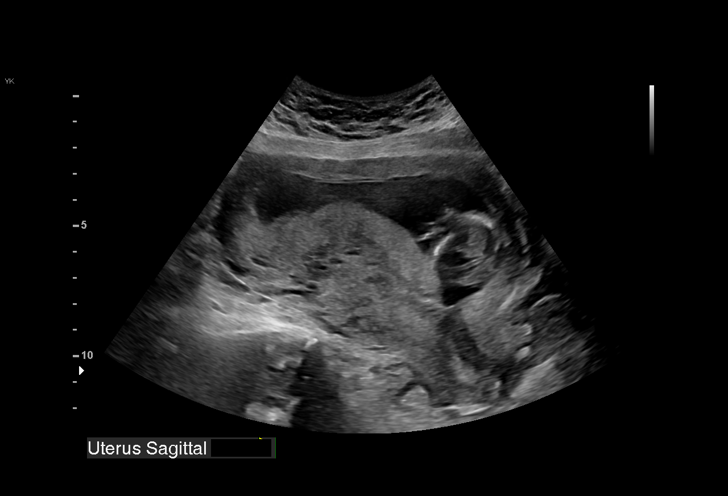
[im 3/37]
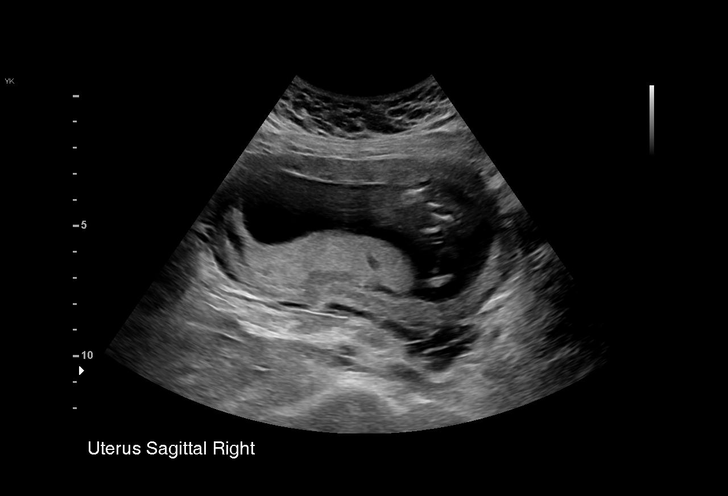
[im 6/37]
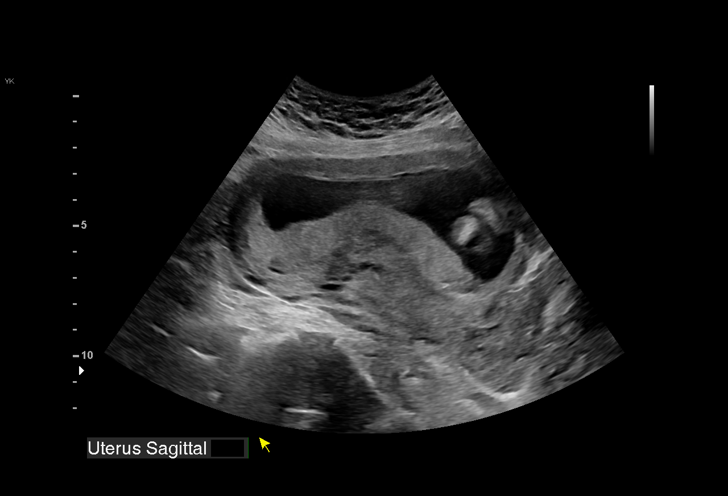
[im 9/37]
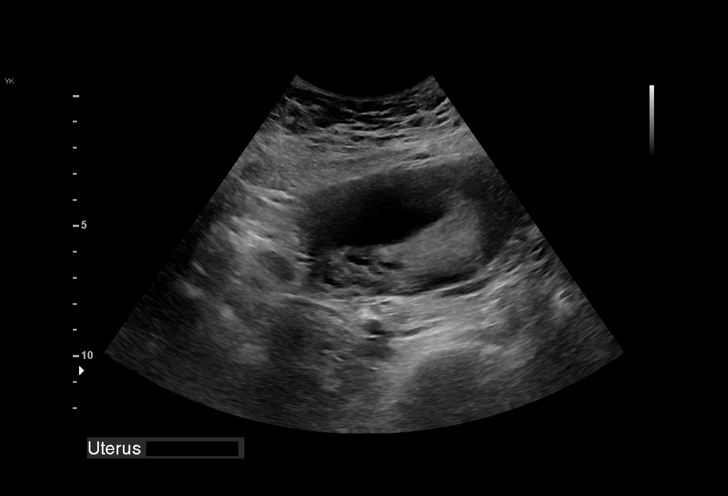
[im 11/37]
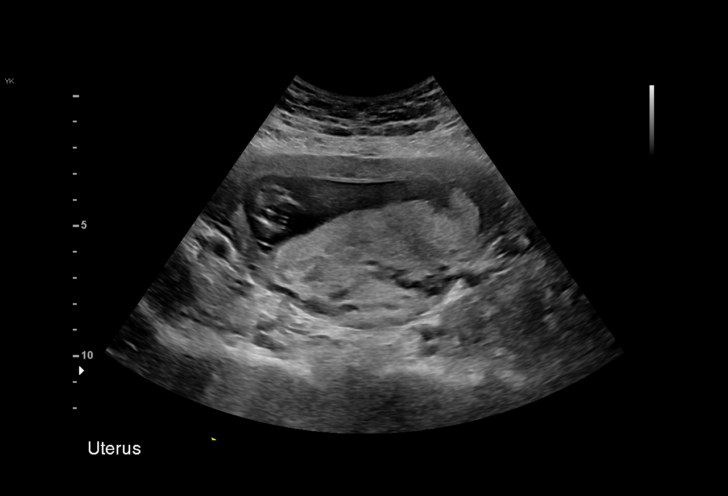
[im 14/37]
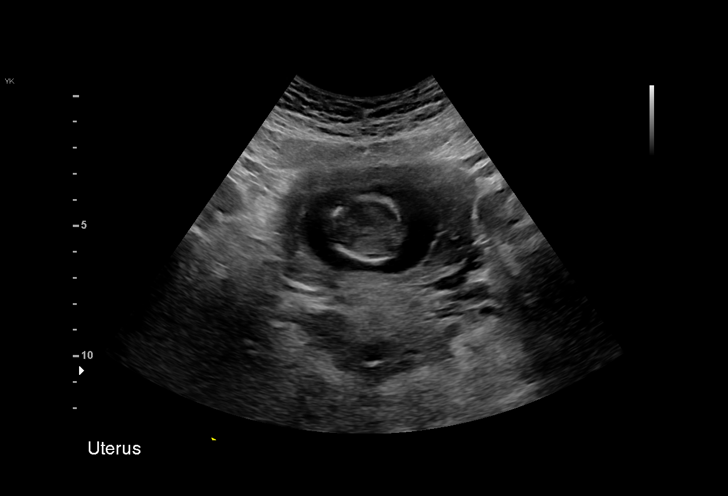
[im 17/37]
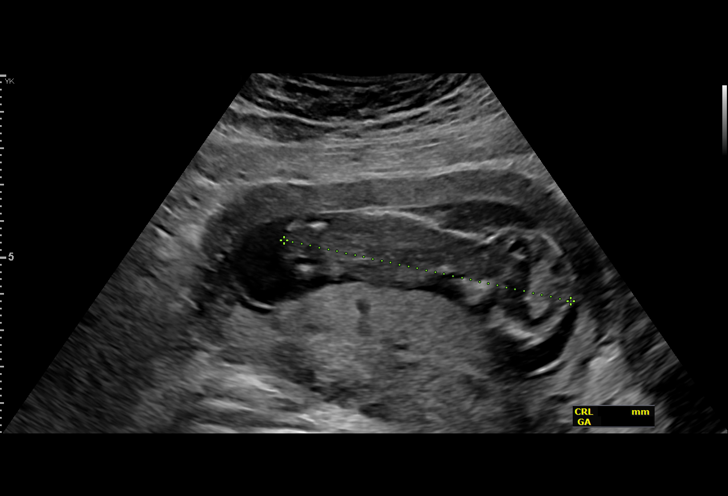
[im 19/37]
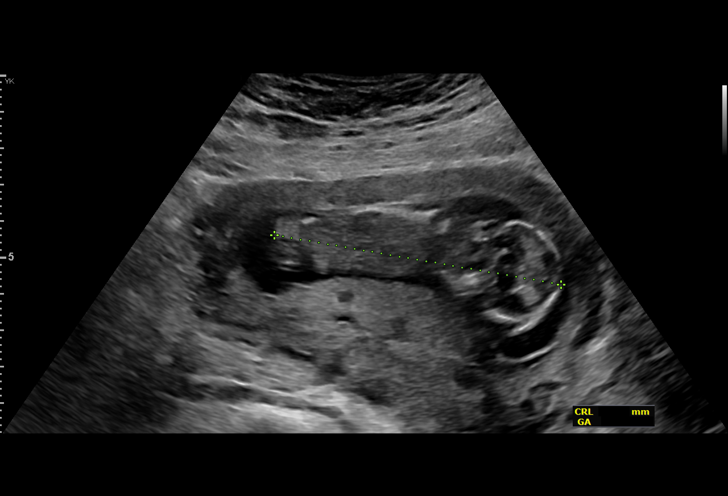
[im 21/37]
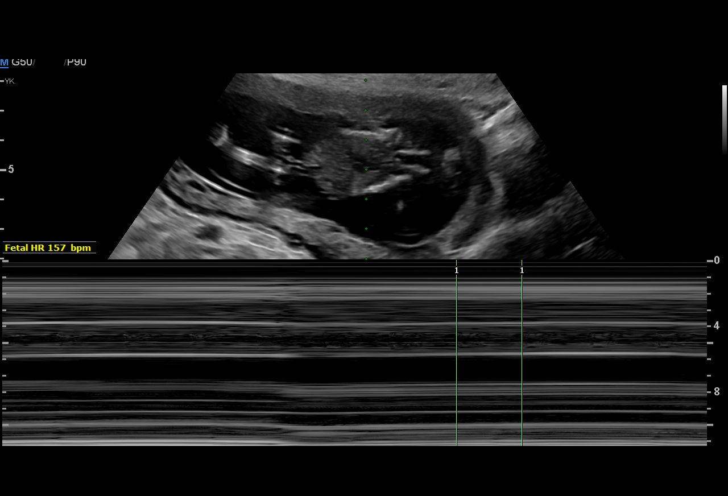
[im 23/37]
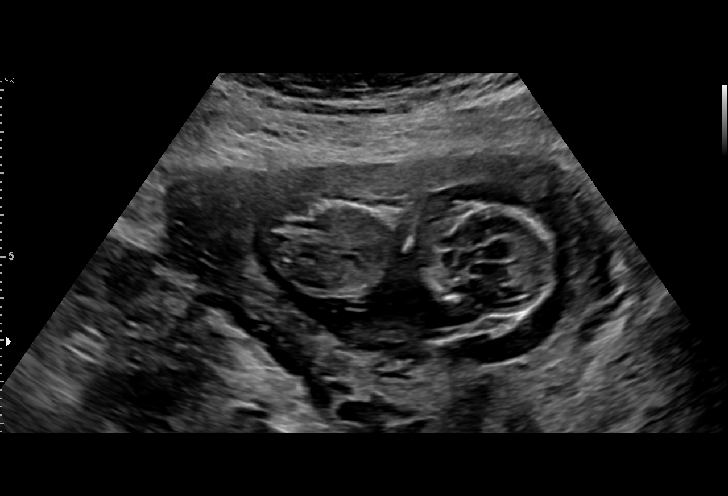
[im 26/37]
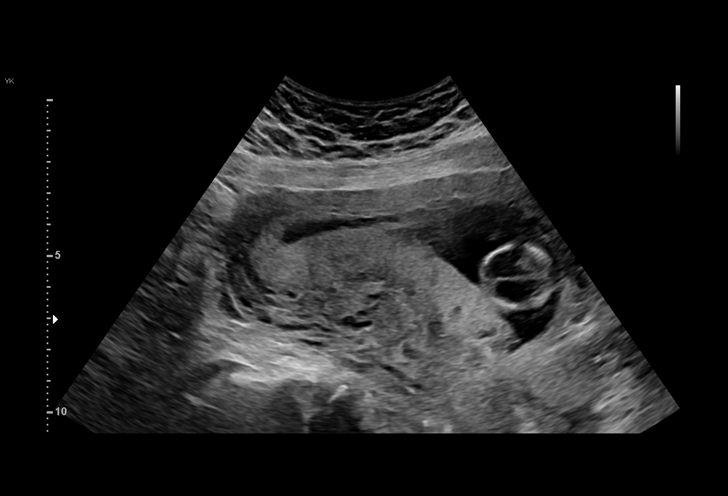
[im 29/37]
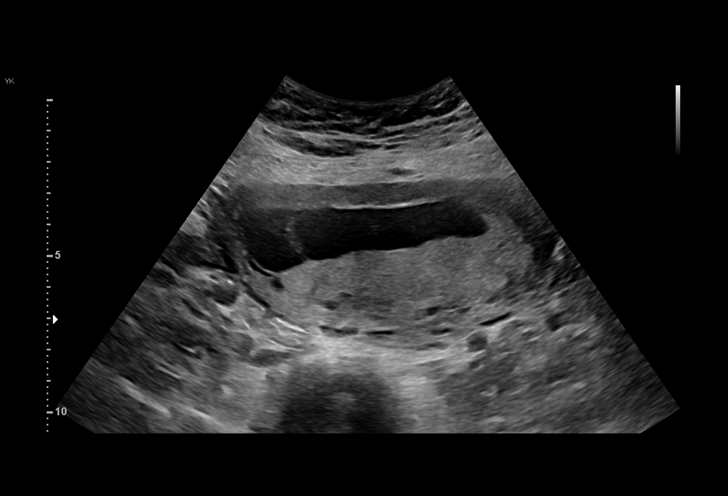
[im 31/37]
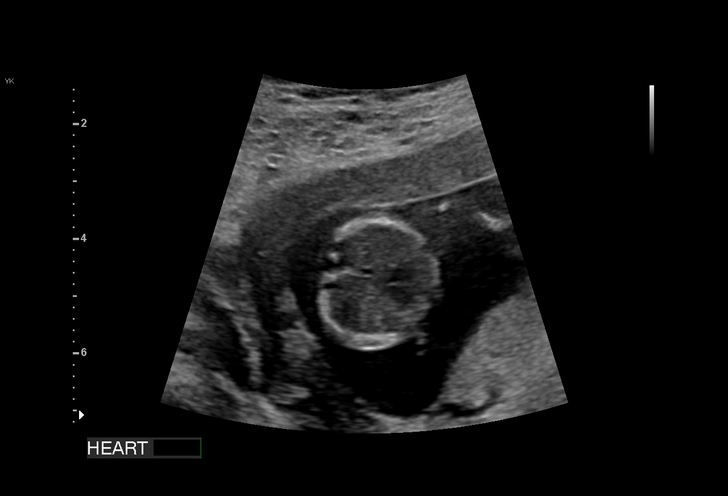
[im 34/37]
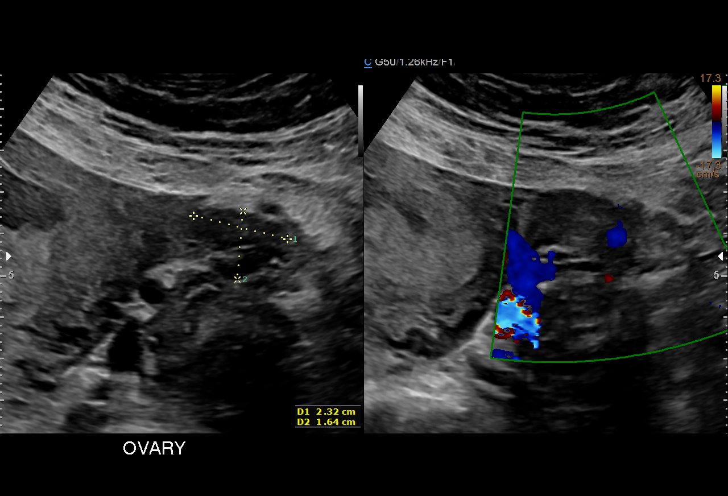
[im 37/37]
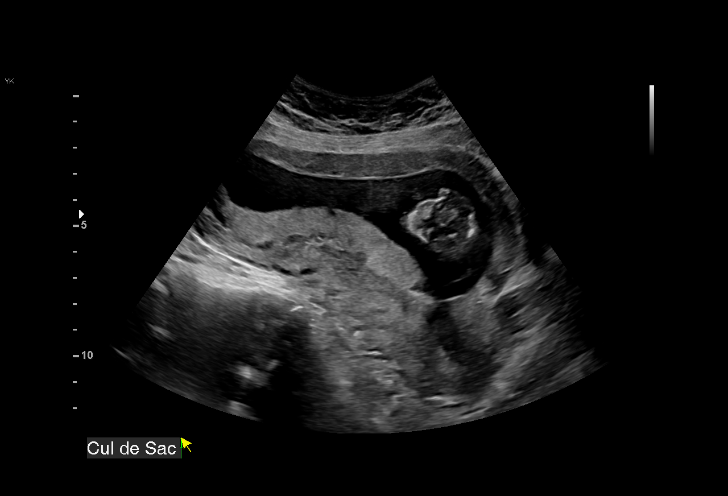

[15 of 28 positions shown; findings below may reference images not displayed]

FINDINGS: Intrauterine gestational sac: Single

Yolk sac:  Not Visualized.

Embryo:  Visualized.

Cardiac Activity: Visualized.

Heart Rate: 153 bpm

CRL:   80 mm   13 w 6 d                  US EDC: 08/17/2020

Subchorionic hemorrhage:  None visualized.

Maternal uterus/adnexae: Normal appearance of both ovaries. No mass
or abnormal free fluid identified.
IMPRESSION: Single living IUP with estimated gestational age of 13 weeks 6 days,
and US EDC of 08/17/2020.

## 2020-09-12 ENCOUNTER — Ambulatory Visit: Payer: Self-pay | Admitting: Obstetrics and Gynecology

## 2020-09-12 ENCOUNTER — Encounter: Payer: Self-pay | Admitting: Obstetrics and Gynecology

## 2020-10-19 ENCOUNTER — Encounter: Payer: Self-pay | Admitting: Nurse Practitioner

## 2020-10-19 ENCOUNTER — Ambulatory Visit (INDEPENDENT_AMBULATORY_CARE_PROVIDER_SITE_OTHER): Payer: Self-pay | Admitting: Nurse Practitioner

## 2020-10-19 ENCOUNTER — Other Ambulatory Visit: Payer: Self-pay

## 2020-10-19 DIAGNOSIS — Z30011 Encounter for initial prescription of contraceptive pills: Secondary | ICD-10-CM

## 2020-10-19 MED ORDER — NORETHINDRONE 0.35 MG PO TABS: 1.0000 | ORAL_TABLET | Freq: Every day | ORAL | 11 refills | Status: AC

## 2020-10-19 NOTE — Progress Notes (Signed)
    Post Partum Visit Note  Bethany Brewer is a 34 y.o. [phone] female who presents for a postpartum visit. She is 10 weeks postpartum following a SVD.  I have fully reviewed the prenatal and intrapartum course. The delivery was at 39.1 gestational weeks.  Anesthesia: none. Postpartum course has been unremarkable. Baby is doing well. Baby is feeding by breast. Bleeding no bleeding. Bowel function is normal. Bladder function is normal. Patient is not sexually active. Contraception method is oral progesterone-only contraceptive. Postpartum depression screening:negative.   The pregnancy intention screening data noted above was reviewed. Potential methods of contraception were discussed. The patient elected to proceed with Oral Contraceptive.  May consider Depo at the [facility] but wants to start pills now.  The following portions of the patient's history were reviewed and updated as appropriate: allergies, current medications, past family history, past medical history, past social history, past surgical history and problem list.  Review of Systems Pertinent items noted in HPI and remainder of comprehensive ROS otherwise negative.    Objective:  Last menstrual period 11/08/2019, unknown if currently breastfeeding.  General:  alert, cooperative and no distress   Breasts:  deferred - breastfeeding  Lungs: clear to auscultation bilaterally  Heart:  regular rate and rhythm, S1, S2 normal, no murmur, click, rub or gallop  Abdomen: deferred   Vulva:  Pelvic deferred  Vagina:   Cervix:    Corpus:   Adnexa:    Rectal Exam:         Assessment:    Normal postpartum exam. Pap smear was in July 2021 at health department.  Plan:   Essential components of care per ACOG recommendations:  1.  Mood and well being: Patient with negative depression screening today. Reviewed local resources for support.  - Patient does not use tobacco.  - hx of drug use? No   2. Infant care and feeding:   -Patient currently breastmilk feeding? Yes  -Social determinants of health (SDOH) reviewed in EPIC. No concerns  3. Sexuality, contraception and birth spacing - Patient does not want a pregnancy in the next year.   - Reviewed forms of contraception in tiered fashion. Patient desired oral progesterone-only contraceptive today.   - Discussed birth spacing of 18 months  4. Sleep and fatigue -Encouraged family/partner/community support of 4 hrs of uninterrupted sleep to help with mood and fatigue  5. Physical Recovery  - Discussed patients delivery - Patient had a small nonsutured periurethral laceration, perineal healing reviewed. Patient expressed understanding - Patient has urinary incontinence? No  - Patient is safe to resume physical and sexual activity  6.  Health Maintenance - Last pap smear done January 2021 and was normal with negative HPV.   Ernestina Patches, CMA [facility] [employer], [facility]  Nolene Bernheim, RN, MSN, NP-BC Nurse Practitioner, [facility] for [employer], [facility] 10/19/2020 3:12 PM

## 2020-10-19 NOTE — Patient Instructions (Signed)
Check out Covid vaccine info here:   [email or web address].uk  7 Things You Should Know About the COVID-19 Vaccines  Here are seven facts you should know before taking your shot:  1.  No serious side effects were reported in clinical trials. Temporary reactions after receiving the vaccine may include a sore arm, headache, feeling tired and achy for a day or two or, in some cases, fever. In most cases, these reactions are good signs that your body is building protection.   2.  Scientists had a head start. They are built on decades of research on vaccines for similar viruses. A big investment of resources and focus made sure they were created without skipping any steps in development, testing, or clinical trials.  3. You cannot get COVID-19 from the vaccine. The vaccine gives your body instructions to make a protein that safely teaches you to make germ-fighting antibodies to fight the real COVID-19.  4.The vaccine protects against the Delta variant. The Delta variant, which is now predominant in [place], is much more contagious than the original virus. Vaccines continue to be remarkably effective in reducing risk of severe disease, hospitalization, and death, even against the Delta variant.  5. A hundred million people in the [place] have already received their COVID-19 vaccine.  6. It works. And once you're fully vaccinated you're protected. The vaccines are proven to help prevent COVID-19 and are effective in preventing hospitalization and death.   7. The vaccine does not affect fertility. Vaccination for those who are pregnant or wanting to become pregnant is recommended by the [employer] of Obstetricians and Gynecologists (ACOG), the Society for Maternal-Fetal Medicine (SMFM), the American Society for Reproductive Medicine (ASRM), and the Society for Female Reproduction and Urology.

## 2020-11-19 IMAGING — US US MFM OB FOLLOW-UP
1 series · 13 of 28 positions shown · non-contrast
Comparison: none

[Series 1: us mfm ob follow-up · 60 acquisitions, 13 frames shown]
[im 3/60]
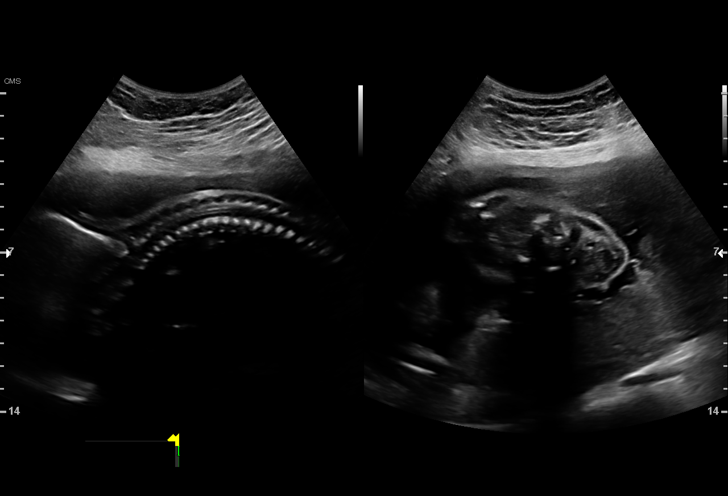
[im 7/60]
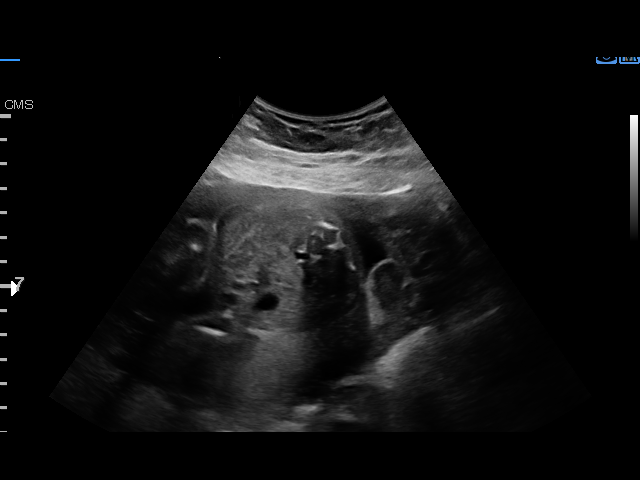
[im 11/60]
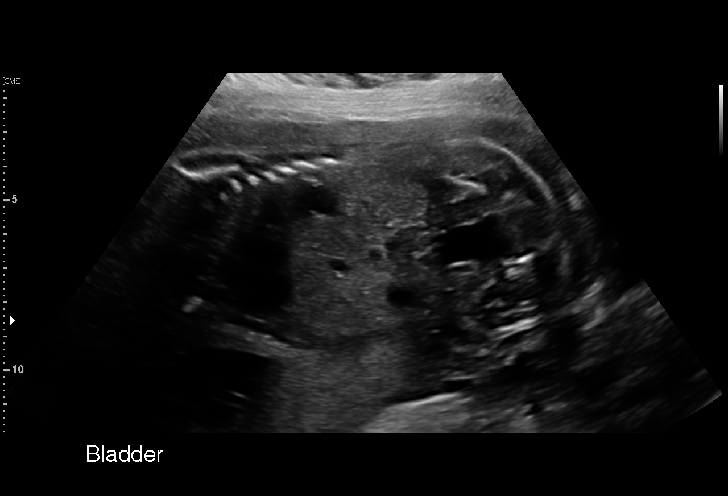
[im 16/60]
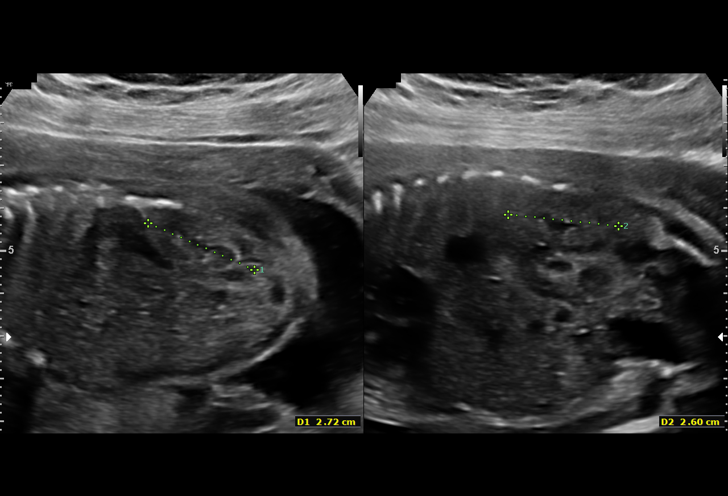
[im 20/60]
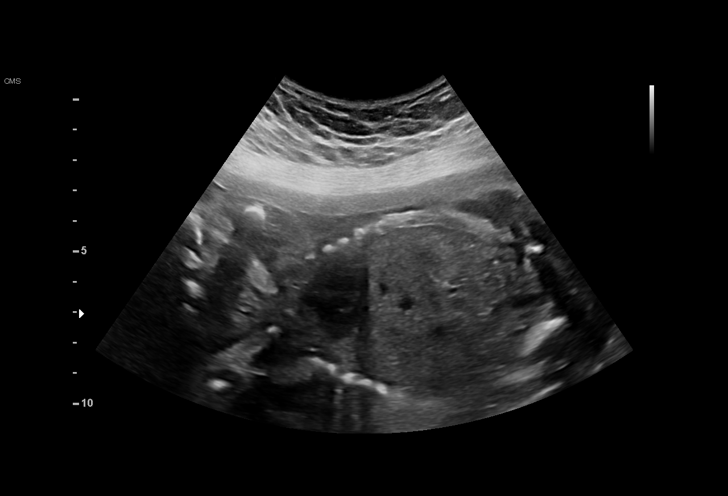
[im 25/60]
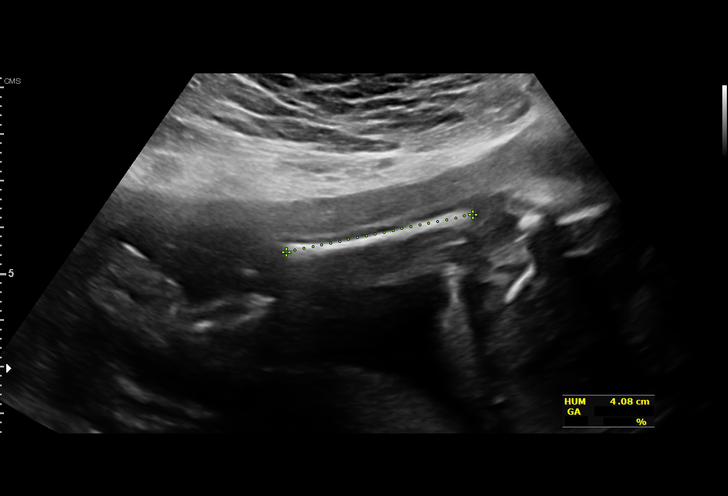
[im 31/60]
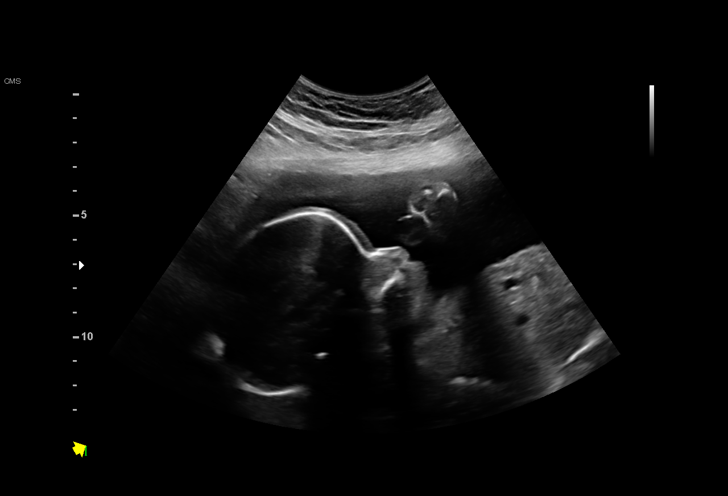
[im 35/60]
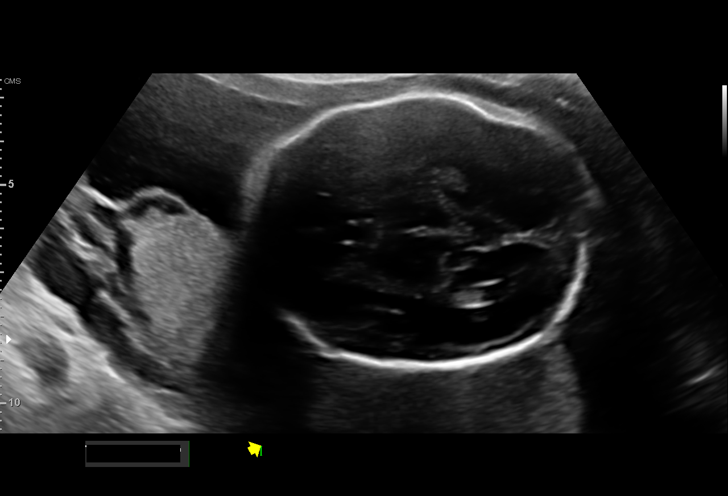
[im 40/60]
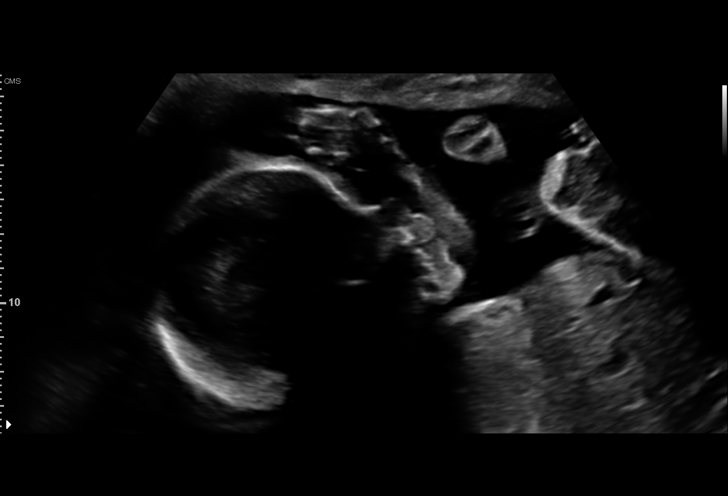
[im 44/60]
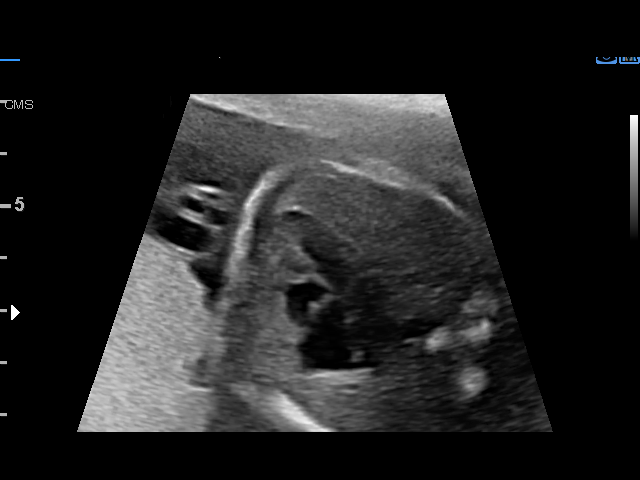
[im 49/60]
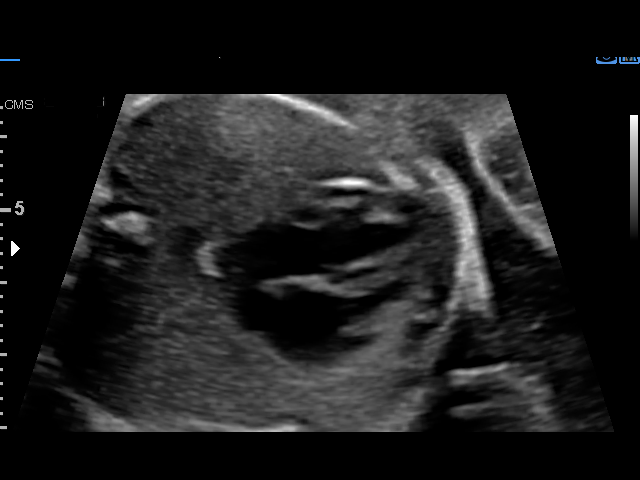
[im 53/60]
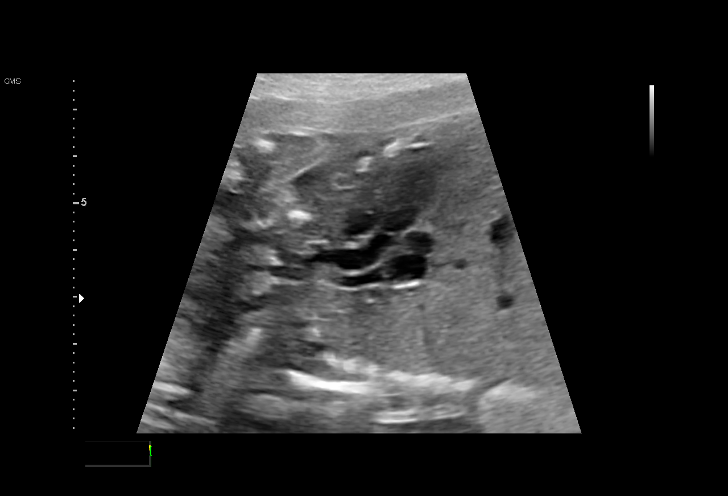
[im 57/60]
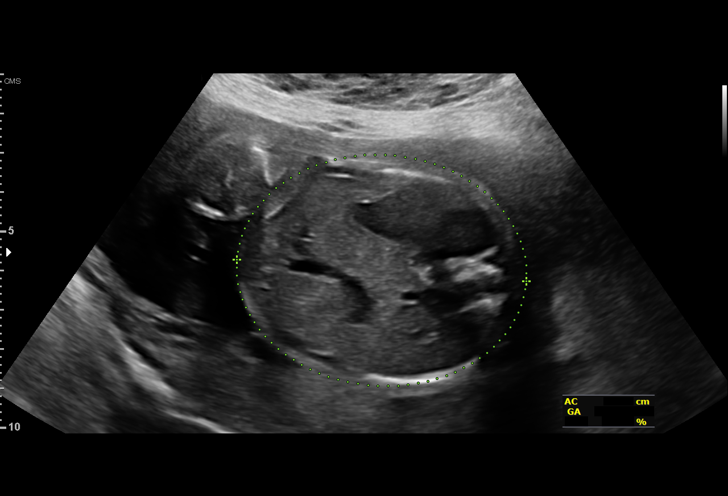

[13 of 28 positions shown; findings below may reference images not displayed]

Indications

 25 weeks gestation of pregnancy
 Obesity complicating pregnancy, second
 trimester (Pre gravid BMI 34.7)
 Antenatal follow-up for nonvisualized fetal
 anatomy
 Poor obstetrical history (prev child with
 multiple anomalies)
Fetal Evaluation

 Num Of Fetuses:         1
 Fetal Heart Rate(bpm):  144
 Cardiac Activity:       Observed
 Presentation:           Breech
 Placenta:               Posterior
 P. Cord Insertion:      Previously Visualized

 Amniotic Fluid
 AFI FV:      Within normal limits

                             Largest Pocket(cm)

Biometry

 BPD:      61.1  mm     G. Age:  24w 6d         26  %    CI:        69.88   %    70 - 86
                                                         FL/HC:      18.5   %    18.7 -
 HC:      233.2  mm     G. Age:  25w 3d         30  %    HC/AC:      1.11        1.04 -
 AC:      209.3  mm     G. Age:  25w 3d         47  %    FL/BPD:     70.5   %    71 - 87
 FL:       43.1  mm     G. Age:  24w 1d          9  %    FL/AC:      20.6   %    20 - 24
 HUM:      40.8  mm     G. Age:  24w 5d         30  %
 Est. FW:     752  gm    1 lb 11 oz      26  %
OB History

 Gravidity:    5         Term:   2         SAB:   1
 TOP:          1        Living:  1
Gestational Age

 LMP:           25w 5d        Date:  11/08/19                 EDD:   08/14/20
 U/S Today:     25w 0d                                        EDD:   08/19/20
 Best:          25w 2d     Det. By:  Early Ultrasound         EDD:   08/17/20
                                     (02/16/20)
Anatomy

 Cranium:               Appears normal         LVOT:                   Appears normal
 Cavum:                 Appears normal         Aortic Arch:            Appears normal
 Ventricles:            Appears normal         Ductal Arch:            Not well visualized
 Choroid Plexus:        Appears normal         Diaphragm:              Appears normal
 Cerebellum:            Appears normal         Stomach:                Appears normal, left
                                                                       sided
 Posterior Fossa:       Appears normal         Abdomen:                Appears normal
 Nuchal Fold:           Not applicable (>20    Abdominal Wall:         Appears nml (cord
                        wks GA)                                        insert, abd wall)
 Face:                  Appears normal         Cord Vessels:           Appears normal (3
                        (orbits and profile)                           vessel cord)
 Lips:                  Appears normal         Kidneys:                Appear normal
 Palate:                Not well visualized    Bladder:                Appears normal
 Thoracic:              Appears normal         Spine:                  Previously seen
 Heart:                 Appears normal         Upper Extremities:      Previously seen
                        (4CH, axis, and
                        situs)
 RVOT:                  Appears normal         Lower Extremities:      Previously seen

 Other:  Heels and 5th digit previously visualized. Technically difficult due to
         maternal habitus and fetal position.
Cervix Uterus Adnexa

 Cervix
 Length:           4.11  cm.
 Normal appearance by transabdominal scan.

 Uterus
 No abnormality visualized.

 Right Ovary
 Not visualized.

 Left Ovary
 Not visualized.

 Cul De Sac
 No free fluid seen.

 Adnexa
 No abnormality visualized.
Impression

 Patient returned for completion of fetal anatomy .Fetal growth
 is appropriate for gestational age .Amniotic fluid is normal
 and good fetal activity is seen .Fetal anatomical survey was
 completed and appears normal.
Recommendations

 -An appointment was made for her to return in 7 weeks for
 fetal growth assessment (poor ob history).
                 Lars, Jesus Damian
